# Patient Record
Sex: Male | Born: 1960 | Race: Black or African American | Hispanic: No | Marital: Married | Smoking: Former smoker
Health system: Southern US, Community
[De-identification: ages and names within clinical notes are randomized; demographics above are authoritative.]

## PROBLEM LIST (undated history)

## (undated) DIAGNOSIS — K429 Umbilical hernia without obstruction or gangrene: Secondary | ICD-10-CM

## (undated) DIAGNOSIS — R42 Dizziness and giddiness: Secondary | ICD-10-CM

## (undated) DIAGNOSIS — K219 Gastro-esophageal reflux disease without esophagitis: Secondary | ICD-10-CM

## (undated) DIAGNOSIS — C801 Malignant (primary) neoplasm, unspecified: Secondary | ICD-10-CM

## (undated) HISTORY — PX: COLON SURGERY: SHX602

## (undated) HISTORY — PX: HERNIA REPAIR: SHX51

## (undated) HISTORY — PX: APPENDECTOMY: SHX54

---

## 2012-04-24 HISTORY — PX: ABDOMINAL SURGERY: SHX537

## 2012-04-24 HISTORY — PX: ABDOMINAL EXPLORATION SURGERY: SHX538

## 2012-04-24 HISTORY — PX: COLONOSCOPY W/ POLYPECTOMY: SHX1380

## 2013-07-30 ENCOUNTER — Emergency Department (HOSPITAL_COMMUNITY): Payer: No Typology Code available for payment source

## 2013-07-30 ENCOUNTER — Encounter (HOSPITAL_COMMUNITY): Payer: Self-pay | Admitting: Emergency Medicine

## 2013-07-30 DIAGNOSIS — Z87891 Personal history of nicotine dependence: Secondary | ICD-10-CM | POA: Insufficient documentation

## 2013-07-30 DIAGNOSIS — Z880 Allergy status to penicillin: Secondary | ICD-10-CM | POA: Insufficient documentation

## 2013-07-30 DIAGNOSIS — J069 Acute upper respiratory infection, unspecified: Secondary | ICD-10-CM | POA: Insufficient documentation

## 2013-07-30 LAB — BASIC METABOLIC PANEL
BUN: 15 mg/dL (ref 6–23)
CHLORIDE: 98 meq/L (ref 96–112)
CO2: 23 mEq/L (ref 19–32)
Calcium: 8.9 mg/dL (ref 8.4–10.5)
Creatinine, Ser: 0.9 mg/dL (ref 0.50–1.35)
GFR calc non Af Amer: >90 mL/min (ref >90)
Glucose, Bld: 90 mg/dL (ref 70–99)
POTASSIUM: 4.4 meq/L (ref 3.7–5.3)
SODIUM: 138 meq/L (ref 137–147)

## 2013-07-30 LAB — CBC
HCT: 42.9 % (ref 39.0–52.0)
Hemoglobin: 14.8 g/dL (ref 13.0–17.0)
MCH: 33.6 pg (ref 26.0–34.0)
MCHC: 34.5 g/dL (ref 30.0–36.0)
MCV: 97.3 fL (ref 78.0–100.0)
PLATELETS: 185 10*3/uL (ref 150–400)
RBC: 4.41 MIL/uL (ref 4.22–5.81)
RDW: 11.8 % (ref 11.5–15.5)
WBC: 6.1 10*3/uL (ref 4.0–10.5)

## 2013-07-30 LAB — I-STAT TROPONIN, ED: Troponin i, poc: 0 ng/mL (ref 0.00–0.08)

## 2013-07-30 LAB — PRO B NATRIURETIC PEPTIDE: PRO B NATRI PEPTIDE: 39.1 pg/mL (ref 0–125)

## 2013-07-30 NOTE — ED Notes (Signed)
Pt states he has been battling a cold for a week. Productive Cough with thick white mucus, Fever, chills Body aches all over with back pain and CP. Top of head is sore to touch.

## 2013-07-31 ENCOUNTER — Emergency Department (HOSPITAL_COMMUNITY)
Admission: EM | Admit: 2013-07-31 | Discharge: 2013-07-31 | Disposition: A | Discharge status: Home / Self Care | Payer: No Typology Code available for payment source | Attending: Emergency Medicine | Admitting: Emergency Medicine

## 2013-07-31 DIAGNOSIS — J069 Acute upper respiratory infection, unspecified: Secondary | ICD-10-CM

## 2013-07-31 MED ORDER — HYDROCODONE-ACETAMINOPHEN 7.5-325 MG/15ML PO SOLN: 10.0000 mL | Freq: Four times a day (QID) | ORAL | Status: DC | PRN

## 2013-07-31 MED ORDER — IBUPROFEN 800 MG PO TABS
800.0000 mg | ORAL_TABLET | Freq: Once | ORAL | Status: AC
  Administered 2013-07-31: 800 mg via ORAL
  Filled 2013-07-31: qty 1

## 2013-07-31 MED ORDER — HYDROCODONE-ACETAMINOPHEN 7.5-325 MG/15ML PO SOLN
10.0000 mL | Freq: Once | ORAL | Status: AC
  Administered 2013-07-31: 10 mL via ORAL
  Filled 2013-07-31: qty 15

## 2013-07-31 MED ORDER — AZITHROMYCIN 250 MG PO TABS: 250.0000 mg | ORAL_TABLET | Freq: Every day | ORAL | Status: DC

## 2013-07-31 MED ORDER — ACETAMINOPHEN 325 MG PO TABS: 650.0000 mg | ORAL_TABLET | Freq: Once | ORAL | Status: DC

## 2013-07-31 NOTE — ED Provider Notes (Signed)
CSN: [id number]     Arrival date & time 07/30/13  2051 History   First MD Initiated Contact with Patient 07/31/13 0350     Chief Complaint  Patient presents with  . URI     (Consider location/radiation/quality/duration/timing/severity/associated sxs/prior Treatment) HPI History provided by patient. Sick for the last 7 days with cough, congestion, body aches, productive sputum and now general malaise. He gets sharp chest pains when he coughs. Complains of fever today. Recently moved here from [place] without any known sick contacts. No rash. No international travel. No shortness of breath. No neck stiffness. Unable to sleep due to cough. Symptoms moderate in severity.  History reviewed. No pertinent past medical history. Past Surgical History  Procedure Laterality Date  . Colon surgery    . Appendectomy     History reviewed. No pertinent family history. History  Substance Use Topics  . Smoking status: Former Research scientist (life sciences)  . Smokeless tobacco: Never Used  . Alcohol Use: No    Review of Systems  Constitutional: Positive for fever and chills.  HENT: Positive for congestion. Negative for voice change.   Respiratory: Positive for cough. Negative for shortness of breath.   Cardiovascular: Positive for chest pain.  Gastrointestinal: Negative for nausea, vomiting and abdominal pain.  Genitourinary: Negative for dysuria.  Musculoskeletal: Negative for neck stiffness.  Skin: Negative for rash.  Neurological: Negative for syncope.  All other systems reviewed and are negative.     Allergies  Penicillins  Home Medications  No current outpatient prescriptions on file. BP 110/61  Pulse 93  Temp(Src) 99.9 F (37.7 C) (Oral)  Resp 18  Ht 5\' 7"  (1.702 m)  Wt 158 lb 6.4 oz (71.85 kg)  BMI 24.80 kg/m2  SpO2 100% Physical Exam  Constitutional: He is oriented to person, place, and time. He appears well-developed and well-nourished.  HENT:  Head: Normocephalic and atraumatic.   Mouth/Throat: Oropharynx is clear and moist. No oropharyngeal exudate.  Eyes: EOM are normal. Pupils are equal, round, and reactive to light.  Neck: Normal range of motion. Neck supple.  Cardiovascular: Normal rate, regular rhythm and intact distal pulses.   Pulmonary/Chest: Effort normal and breath sounds normal. No respiratory distress. He exhibits no tenderness.  Intermittent dry cough during exam  Abdominal: Soft. Bowel sounds are normal. He exhibits no distension.  Musculoskeletal: Normal range of motion. He exhibits no edema and no tenderness.  Neurological: He is alert and oriented to person, place, and time. No cranial nerve deficit.  Skin: Skin is warm and dry.    ED Course  Procedures (including critical care time) Labs Review Labs Reviewed  [place], ED   Imaging Review Dg Chest 2 View  07/30/2013   CLINICAL DATA:  URI. Left-sided chest pain with cough, congestion and fever.  EXAM: CHEST  2 VIEW  COMPARISON:  None.  FINDINGS: Lungs are clear. Cardiomediastinal silhouette is within normal. Fundal soft tissues are normal.  IMPRESSION: No active cardiopulmonary disease.   Electronically Signed   By: Marin Olp M.D.   On: 07/30/2013 22:48   Treated with Motrin, and Lortab elixir for pain control/antitussive  Plan discharge home with URI precautions and instructions. Z-Pak provided.  Outpatient referral provided by request Patient will call to schedule primary care physician followup and return emergency department for any worsening condition.   MDM   Dx: URI  Evaluated with chest x-ray, labs obtained and reviewed as above. No leukocytosis. Chest  x-ray does not demonstrate any infiltrates or acute cardiopulmonary abnormality.  Medications provided  Vital signs and nursing notes reviewed and considered  Teressa Lower, MD 07/31/13 [phone]

## 2013-07-31 NOTE — ED Notes (Signed)
MD at bedside. 

## 2013-07-31 NOTE — Discharge Instructions (Signed)

## 2013-10-27 ENCOUNTER — Emergency Department (HOSPITAL_COMMUNITY): Payer: PRIVATE HEALTH INSURANCE

## 2013-10-27 ENCOUNTER — Emergency Department (HOSPITAL_COMMUNITY)
Admission: EM | Admit: 2013-10-27 | Discharge: 2013-10-27 | Disposition: A | Discharge status: Home / Self Care | Payer: PRIVATE HEALTH INSURANCE | Attending: Emergency Medicine | Admitting: Emergency Medicine

## 2013-10-27 ENCOUNTER — Encounter (HOSPITAL_COMMUNITY): Payer: Self-pay | Admitting: Emergency Medicine

## 2013-10-27 DIAGNOSIS — R1084 Generalized abdominal pain: Secondary | ICD-10-CM

## 2013-10-27 DIAGNOSIS — Z9089 Acquired absence of other organs: Secondary | ICD-10-CM | POA: Insufficient documentation

## 2013-10-27 DIAGNOSIS — Z9889 Other specified postprocedural states: Secondary | ICD-10-CM | POA: Insufficient documentation

## 2013-10-27 DIAGNOSIS — K439 Ventral hernia without obstruction or gangrene: Secondary | ICD-10-CM | POA: Insufficient documentation

## 2013-10-27 DIAGNOSIS — K429 Umbilical hernia without obstruction or gangrene: Secondary | ICD-10-CM | POA: Insufficient documentation

## 2013-10-27 DIAGNOSIS — Z87891 Personal history of nicotine dependence: Secondary | ICD-10-CM | POA: Insufficient documentation

## 2013-10-27 LAB — COMPREHENSIVE METABOLIC PANEL
ALK PHOS: 59 U/L (ref 39–117)
ALT: 38 U/L (ref 0–53)
ANION GAP: 11 (ref 5–15)
AST: 20 U/L (ref 0–37)
Albumin: 3.9 g/dL (ref 3.5–5.2)
BUN: 12 mg/dL (ref 6–23)
CO2: 27 mEq/L (ref 19–32)
CREATININE: 1.05 mg/dL (ref 0.50–1.35)
Calcium: 9.3 mg/dL (ref 8.4–10.5)
Chloride: 105 mEq/L (ref 96–112)
GFR calc Af Amer: >90 mL/min (ref >90)
GFR calc non Af Amer: 80 mL/min — ABNORMAL LOW (ref >90)
Glucose, Bld: 98 mg/dL (ref 70–99)
POTASSIUM: 4.3 meq/L (ref 3.7–5.3)
Sodium: 143 mEq/L (ref 137–147)
TOTAL PROTEIN: 6.9 g/dL (ref 6.0–8.3)
Total Bilirubin: 0.4 mg/dL (ref 0.3–1.2)

## 2013-10-27 LAB — URINALYSIS, ROUTINE W REFLEX MICROSCOPIC
Bilirubin Urine: NEGATIVE
Glucose, UA: NEGATIVE mg/dL
Hgb urine dipstick: NEGATIVE
Ketones, ur: NEGATIVE mg/dL
LEUKOCYTES UA: NEGATIVE
Nitrite: NEGATIVE
PH: 7 (ref 5.0–8.0)
Protein, ur: NEGATIVE mg/dL
Specific Gravity, Urine: 1.005 (ref 1.005–1.030)
UROBILINOGEN UA: 0.2 mg/dL (ref 0.0–1.0)

## 2013-10-27 LAB — CBC
HEMATOCRIT: 43.5 % (ref 39.0–52.0)
Hemoglobin: 14.6 g/dL (ref 13.0–17.0)
MCH: 32.5 pg (ref 26.0–34.0)
MCHC: 33.6 g/dL (ref 30.0–36.0)
MCV: 96.9 fL (ref 78.0–100.0)
Platelets: 221 10*3/uL (ref 150–400)
RBC: 4.49 MIL/uL (ref 4.22–5.81)
RDW: 12.2 % (ref 11.5–15.5)
WBC: 6.9 10*3/uL (ref 4.0–10.5)

## 2013-10-27 LAB — LIPASE, BLOOD: LIPASE: 24 U/L (ref 11–59)

## 2013-10-27 MED ORDER — SODIUM CHLORIDE 0.9 % IV BOLUS (SEPSIS)
1000.0000 mL | Freq: Once | INTRAVENOUS | Status: AC
  Administered 2013-10-27: 1000 mL via INTRAVENOUS

## 2013-10-27 MED ORDER — IOHEXOL 300 MG/ML  SOLN
50.0000 mL | Freq: Once | INTRAMUSCULAR | Status: AC | PRN
  Administered 2013-10-27: 50 mL via ORAL

## 2013-10-27 MED ORDER — IOHEXOL 300 MG/ML  SOLN
100.0000 mL | Freq: Once | INTRAMUSCULAR | Status: AC | PRN
  Administered 2013-10-27: 100 mL via INTRAVENOUS

## 2013-10-27 NOTE — ED Notes (Signed)
Patient presents today with a chief complaint of periumbilical abdominal pain x 3 weeks with worsening pain and color change of stools to "dark green". Patient reports he's had abdominal surgery last year for a ruptured polyp. Patient denies bloody stools, diarrhea, and vomiting.

## 2013-10-27 NOTE — ED Provider Notes (Signed)
CSN: [id number]     Arrival date & time 10/27/13  1034 History   First MD Initiated Contact with Patient 10/27/13 1042     Chief Complaint  Patient presents with  . Abdominal Pain     (Consider location/radiation/quality/duration/timing/severity/associated sxs/prior Treatment) Patient is a 53 y.o. male presenting with abdominal pain. The history is provided by the patient.  Abdominal Pain Associated symptoms: no chest pain, no cough, no diarrhea, no dysuria, no fever, no hematuria, no shortness of breath, no sore throat and no vomiting   pt with hx exp lap approx 1 yr ago due to colon perf during colonoscopy, c/o mid/diffuse abdominal pain in the past few weeks. Constant, dull, waxing and waning in intensity, non radiating. w pain, denies specific exacerbating or alleviating factors. States in past couple weeks pain seems worse. Occurs at rest. No relation to eating. No relation to activity or exertion.  Nausea. No vomiting. Having normal bms. Denies previous hx similar abd pain. No wt loss. No dysuria, hematuria or gu c/o. No fever or chills.      History reviewed. No pertinent past medical history. Past Surgical History  Procedure Laterality Date  . Colon surgery    . Appendectomy    . Abdominal surgery     No family history on file. History  Substance Use Topics  . Smoking status: Former Research scientist (life sciences)  . Smokeless tobacco: Never Used  . Alcohol Use: No    Review of Systems  Constitutional: Negative for fever.  HENT: Negative for sore throat.   Eyes: Negative for redness.  Respiratory: Negative for cough and shortness of breath.   Cardiovascular: Negative for chest pain.  Gastrointestinal: Positive for abdominal pain. Negative for vomiting and diarrhea.  Genitourinary: Negative for dysuria, hematuria and flank pain.  Musculoskeletal: Negative for back pain and neck pain.  Skin: Negative for rash.  Neurological: Negative for headaches.  Hematological: Does not bruise/bleed  easily.  Psychiatric/Behavioral: Negative for confusion.      Allergies  Penicillins  Home Medications   Prior to Admission medications   Not on File   BP 123/73  Pulse 76  Temp(Src) 97.8 F (36.6 C) (Oral)  Resp 18  SpO2 99% Physical Exam  Nursing note and vitals reviewed. Constitutional: He is oriented to person, place, and time. He appears well-developed and well-nourished. No distress.  HENT:  Head: Atraumatic.  Eyes: Conjunctivae are normal. No scleral icterus.  Neck: Neck supple. No tracheal deviation present.  Cardiovascular: Normal rate, regular rhythm, normal heart sounds and intact distal pulses.  Exam reveals no gallop and no friction rub.   No murmur heard. Pulmonary/Chest: Effort normal and breath sounds normal. No accessory muscle usage. No respiratory distress.  Abdominal: Soft. Bowel sounds are normal. He exhibits no distension and no mass. There is tenderness. There is no rebound and no guarding.  Midline healed surgical scar. ?reduced ventral hernia. Mid abd tenderness, no rebound or guarding.   Genitourinary:  No cva tenderness.   Musculoskeletal: Normal range of motion. He exhibits no edema and no tenderness.  Neurological: He is alert and oriented to person, place, and time.  Skin: Skin is warm and dry. He is not diaphoretic.  Psychiatric: He has a normal mood and affect.    ED Course  Procedures (including critical care time) Labs Review  Results for orders placed during the hospital encounter of 10/27/13  CBC      Result Value Ref Range   WBC 6.9  4.0 - 10.5 K/uL  RBC 4.49  4.22 - 5.81 MIL/uL   Hemoglobin 14.6  13.0 - 17.0 g/dL   HCT 43.5  39.0 - 52.0 %   MCV 96.9  78.0 - 100.0 fL   MCH 32.5  26.0 - 34.0 pg   MCHC 33.6  30.0 - 36.0 g/dL   RDW 12.2  11.5 - 15.5 %   Platelets 221  150 - 400 K/uL  COMPREHENSIVE METABOLIC PANEL      Result Value Ref Range   Sodium 143  137 - 147 mEq/L   Potassium 4.3  3.7 - 5.3 mEq/L   Chloride 105  96 -  112 mEq/L   CO2 27  19 - 32 mEq/L   Glucose, Bld 98  70 - 99 mg/dL   BUN 12  6 - 23 mg/dL   Creatinine, Ser 1.05  0.50 - 1.35 mg/dL   Calcium 9.3  8.4 - 10.5 mg/dL   Total Protein 6.9  6.0 - 8.3 g/dL   Albumin 3.9  3.5 - 5.2 g/dL   AST 20  0 - 37 U/L   ALT 38  0 - 53 U/L   Alkaline Phosphatase 59  39 - 117 U/L   Total Bilirubin 0.4  0.3 - 1.2 mg/dL   GFR calc non Af Amer 80 (*) >90 mL/min   GFR calc Af Amer >90  >90 mL/min   Anion gap 11  5 - 15  LIPASE, BLOOD      Result Value Ref Range   Lipase 24  11 - 59 U/L  URINALYSIS, ROUTINE W REFLEX MICROSCOPIC      Result Value Ref Range   Color, Urine STRAW (*) YELLOW   APPearance CLEAR  CLEAR   Specific Gravity, Urine 1.005  1.005 - 1.030   pH 7.0  5.0 - 8.0   Glucose, UA NEGATIVE  NEGATIVE mg/dL   Hgb urine dipstick NEGATIVE  NEGATIVE   Bilirubin Urine NEGATIVE  NEGATIVE   Ketones, ur NEGATIVE  NEGATIVE mg/dL   Protein, ur NEGATIVE  NEGATIVE mg/dL   Urobilinogen, UA 0.2  0.0 - 1.0 mg/dL   Nitrite NEGATIVE  NEGATIVE   Leukocytes, UA NEGATIVE  NEGATIVE   Ct Abdomen Pelvis W Contrast  10/27/2013   CLINICAL DATA:  Abdominal pain.  EXAM: CT ABDOMEN AND PELVIS WITH CONTRAST  TECHNIQUE: Multidetector CT imaging of the abdomen and pelvis was performed using the standard protocol following bolus administration of intravenous contrast.  CONTRAST:  45mL OMNIPAQUE IOHEXOL 300 MG/ML SOLN, 144mL OMNIPAQUE IOHEXOL 300 MG/ML SOLN  COMPARISON:  None.  FINDINGS: The lung bases are clear. No pleural effusion or pulmonary nodule. The heart is normal in size. No pericardial effusion. The distal esophagus is grossly normal.  The liver is is unremarkable. No focal lesions or biliary dilatation. The gallbladder is normal. No common bile duct dilatation. The pancreas is normal. The spleen is normal in size. No focal lesions. The adrenal glands and kidneys are normal except for left renal cysts. No hydronephrosis. No ureteral or bladder calculi.  The stomach,  duodenum, small bowel and colon are unremarkable. No inflammatory changes, mass lesions or obstructive findings. No mesenteric or retroperitoneal mass or adenopathy small scattered lymph nodes are noted. The aorta and branch vessels are patent the major venous structures are patent.  The bladder, prostate gland and seminal vesicles are unremarkable. No pelvic mass, adenopathy or free pelvic fluid collections. Small scattered lymph nodes are noted. No inguinal mass or adenopathy. There are small anterior abdominal  wall hernias. One is located left paramidline above the umbilicus. There is also a smaller right paramidline near the umbilicus and a small umbilical hernia.  The bony structures are unremarkable.  IMPRESSION: 1. No acute abdominal/pelvic findings, mass lesions or adenopathy. 2. Simple appearing left renal cysts. 3. Small anterior abdominal wall hernias.   Electronically Signed   By: Kalman Jewels M.D.   On: 10/27/2013 12:42     MDM  Iv ns. Labs. Ct.  Reviewed nursing notes and prior charts for additional history.   Recheck abd soft nt. No nv. Afeb.   Discussed ct w pt and need for gen surg f/u.  Pt appears stable for d/c.     Mirna Mires, MD 10/28/13 1807

## 2013-10-27 NOTE — ED Notes (Signed)
Pt reports abdominal pain for 3-4 weeks. Pt reports dark green stool yesterday. Pt reports tender to umbilicus and lower abdominal pain. Pt reports nausea but denies vomiting. Pt reports hx of polyps.

## 2013-10-27 NOTE — ED Notes (Signed)
Pt escorted to discharge window. Verbalized understanding discharge instructions. In no acute distress.   

## 2013-10-27 NOTE — ED Notes (Signed)
Pt encouraged to void when able. 

## 2013-10-27 NOTE — ED Notes (Signed)
Patient transported to CT 

## 2013-10-27 NOTE — Discharge Instructions (Signed)
Your ct scan was read as showing:  There are small anterior abdominal wall hernias. One is located left paramidline above the umbilicus. There is also a smaller right paramidline near the umbilicus and a small umbilical hernia.  The bony structures are unremarkable.  IMPRESSION: 1. No acute abdominal/pelvic findings, mass lesions or adenopathy. 2. Simple appearing left renal cysts. 3. Small anterior abdominal wall hernias.  For hernias, follow up with general surgeon in the next 1-2 weeks - see referral - call office to arrange appointment.  Return to ER if worse, worsening or severe abdominal pain, persistent vomiting, other concern.      Hernia A hernia occurs when an internal organ pushes out through a weak spot in the abdominal wall. Hernias most commonly occur in the groin and around the navel. Hernias often can be pushed back into place (reduced). Most hernias tend to get worse over time. Some abdominal hernias can get stuck in the opening (irreducible or incarcerated hernia) and cannot be reduced. An irreducible abdominal hernia which is tightly squeezed into the opening is at risk for impaired blood supply (strangulated hernia). A strangulated hernia is a medical emergency. Because of the risk for an irreducible or strangulated hernia, surgery may be recommended to repair a hernia. CAUSES   Heavy lifting.  Prolonged coughing.  Straining to have a bowel movement.  A cut (incision) made during an abdominal surgery. HOME CARE INSTRUCTIONS   Bed rest is not required. You may continue your normal activities.  Avoid lifting more than 10 pounds (4.5 kg) or straining.  Cough gently. If you are a smoker it is best to stop. Even the best hernia repair can break down with the continual strain of coughing. Even if you do not have your hernia repaired, a cough will continue to aggravate the problem.  Do not wear anything tight over your hernia. Do not try to keep it in with an  outside bandage or truss. These can damage abdominal contents if they are trapped within the hernia sac.  Eat a normal diet.  Avoid constipation. Straining over long periods of time will increase hernia size and encourage breakdown of repairs. If you cannot do this with diet alone, stool softeners may be used. SEEK IMMEDIATE MEDICAL CARE IF:   You have a fever.  You develop increasing abdominal pain.  You feel nauseous or vomit.  Your hernia is stuck outside the abdomen, looks discolored, feels hard, or is tender.  You have any changes in your bowel habits or in the hernia that are unusual for you.  You have increased pain or swelling around the hernia.  You cannot push the hernia back in place by applying gentle pressure while lying down. MAKE SURE YOU:   Understand these instructions.  Will watch your condition.  Will get help right away if you are not doing well or get worse. Document Released: 04/10/2005 Document Revised: 07/03/2011 Document Reviewed: 11/28/2007 [facility] Patient Information 2015 [place], [place]. This information is not intended to replace advice given to you by your health care provider. Make sure you discuss any questions you have with your health care provider.

## 2013-11-13 ENCOUNTER — Ambulatory Visit (INDEPENDENT_AMBULATORY_CARE_PROVIDER_SITE_OTHER): Payer: No Typology Code available for payment source | Admitting: Surgery

## 2013-11-13 ENCOUNTER — Ambulatory Visit (INDEPENDENT_AMBULATORY_CARE_PROVIDER_SITE_OTHER): Payer: No Typology Code available for payment source | Admitting: General Surgery

## 2013-12-03 ENCOUNTER — Ambulatory Visit (INDEPENDENT_AMBULATORY_CARE_PROVIDER_SITE_OTHER): Payer: No Typology Code available for payment source | Admitting: General Surgery

## 2014-03-25 ENCOUNTER — Encounter (HOSPITAL_COMMUNITY): Payer: Self-pay | Admitting: Emergency Medicine

## 2014-03-25 ENCOUNTER — Emergency Department (HOSPITAL_COMMUNITY)
Admission: EM | Admit: 2014-03-25 | Discharge: 2014-03-25 | Disposition: A | Discharge status: Home / Self Care | Payer: No Typology Code available for payment source | Attending: Emergency Medicine | Admitting: Emergency Medicine

## 2014-03-25 ENCOUNTER — Emergency Department (HOSPITAL_COMMUNITY): Payer: No Typology Code available for payment source

## 2014-03-25 DIAGNOSIS — R079 Chest pain, unspecified: Secondary | ICD-10-CM | POA: Insufficient documentation

## 2014-03-25 DIAGNOSIS — R1011 Right upper quadrant pain: Secondary | ICD-10-CM | POA: Diagnosis not present

## 2014-03-25 DIAGNOSIS — Z880 Allergy status to penicillin: Secondary | ICD-10-CM | POA: Insufficient documentation

## 2014-03-25 DIAGNOSIS — R1012 Left upper quadrant pain: Secondary | ICD-10-CM | POA: Diagnosis not present

## 2014-03-25 DIAGNOSIS — R112 Nausea with vomiting, unspecified: Secondary | ICD-10-CM | POA: Diagnosis not present

## 2014-03-25 DIAGNOSIS — Z9089 Acquired absence of other organs: Secondary | ICD-10-CM | POA: Diagnosis not present

## 2014-03-25 DIAGNOSIS — R1013 Epigastric pain: Secondary | ICD-10-CM | POA: Insufficient documentation

## 2014-03-25 DIAGNOSIS — R109 Unspecified abdominal pain: Secondary | ICD-10-CM

## 2014-03-25 DIAGNOSIS — Z9889 Other specified postprocedural states: Secondary | ICD-10-CM | POA: Diagnosis not present

## 2014-03-25 DIAGNOSIS — Z720 Tobacco use: Secondary | ICD-10-CM | POA: Diagnosis not present

## 2014-03-25 HISTORY — DX: Umbilical hernia without obstruction or gangrene: K42.9

## 2014-03-25 LAB — COMPREHENSIVE METABOLIC PANEL
ALK PHOS: 57 U/L (ref 39–117)
ALT: 37 U/L (ref 0–53)
AST: 22 U/L (ref 0–37)
Albumin: 4.1 g/dL (ref 3.5–5.2)
Anion gap: 16 — ABNORMAL HIGH (ref 5–15)
BILIRUBIN TOTAL: 0.6 mg/dL (ref 0.3–1.2)
BUN: 12 mg/dL (ref 6–23)
CALCIUM: 9.4 mg/dL (ref 8.4–10.5)
CHLORIDE: 103 meq/L (ref 96–112)
CO2: 24 meq/L (ref 19–32)
Creatinine, Ser: 0.94 mg/dL (ref 0.50–1.35)
GFR calc Af Amer: >90 mL/min (ref >90)
Glucose, Bld: 123 mg/dL — ABNORMAL HIGH (ref 70–99)
Potassium: 4 mEq/L (ref 3.7–5.3)
SODIUM: 143 meq/L (ref 137–147)
Total Protein: 7.6 g/dL (ref 6.0–8.3)

## 2014-03-25 LAB — CBC
HCT: 40.6 % (ref 39.0–52.0)
Hemoglobin: 14 g/dL (ref 13.0–17.0)
MCH: 32.6 pg (ref 26.0–34.0)
MCHC: 34.5 g/dL (ref 30.0–36.0)
MCV: 94.4 fL (ref 78.0–100.0)
Platelets: 229 10*3/uL (ref 150–400)
RBC: 4.3 MIL/uL (ref 4.22–5.81)
RDW: 11.8 % (ref 11.5–15.5)
WBC: 7.1 10*3/uL (ref 4.0–10.5)

## 2014-03-25 LAB — LIPASE, BLOOD: Lipase: 19 U/L (ref 11–59)

## 2014-03-25 MED ORDER — SUCRALFATE 1 G PO TABS: 1.0000 g | ORAL_TABLET | Freq: Three times a day (TID) | ORAL | Status: DC

## 2014-03-25 MED ORDER — OMEPRAZOLE 20 MG PO CPDR: 20.0000 mg | DELAYED_RELEASE_CAPSULE | Freq: Every day | ORAL | Status: DC

## 2014-03-25 MED ORDER — FAMOTIDINE 20 MG PO TABS: 20.0000 mg | ORAL_TABLET | Freq: Two times a day (BID) | ORAL | Status: DC

## 2014-03-25 MED ORDER — GI COCKTAIL ~~LOC~~
30.0000 mL | Freq: Once | ORAL | Status: AC
  Administered 2014-03-25: 30 mL via ORAL
  Filled 2014-03-25: qty 30

## 2014-03-25 MED ORDER — ASPIRIN 81 MG PO CHEW
324.0000 mg | CHEWABLE_TABLET | Freq: Once | ORAL | Status: AC
  Administered 2014-03-25: 324 mg via ORAL
  Filled 2014-03-25: qty 4

## 2014-03-25 MED ORDER — ONDANSETRON HCL 4 MG/2ML IJ SOLN
4.0000 mg | Freq: Once | INTRAMUSCULAR | Status: DC
  Filled 2014-03-25 (×2): qty 2

## 2014-03-25 NOTE — ED Provider Notes (Signed)
CSN: [id number]     Arrival date & time 03/25/14  2017 History   First MD Initiated Contact with Patient 03/25/14 2047     Chief Complaint  Patient presents with  . Chest Pain     (Consider location/radiation/quality/duration/timing/severity/associated sxs/prior Treatment) HPI Mitchell Adams is a 53 y.o. male who presents to ED with complaint of epigastric chest pain. Pt states pain comes and goes. Independent of exertion. Worsened with eating. Today he was eating dinner, states about 30 min after developed increased pain in epigastric area radiating towards the chest. States pain also radiated into the back and around both sides. Denies changes in bowels. No fver, chills. Pain now resolved. States hx of similar pain, was seen in ED few weeks ago, told it was related to his hernias. States hx of colon resection, with large incision in mid abdomen, with ventral hernias. Pt denies blood in his stool. Denies dark tarry stools. Last bowel movement this morning and normal. No blood in emesis. No other complaints.     Past Medical History  Diagnosis Date  . Hernia, umbilical    Past Surgical History  Procedure Laterality Date  . Colon surgery    . Appendectomy    . Abdominal surgery     No family history on file. History  Substance Use Topics  . Smoking status: Current Every Day Smoker  . Smokeless tobacco: Never Used  . Alcohol Use: No    Review of Systems  Constitutional: Negative for fever and chills.  Respiratory: Negative for cough, chest tightness and shortness of breath.   Cardiovascular: Positive for chest pain. Negative for palpitations and leg swelling.  Gastrointestinal: Positive for nausea, vomiting and abdominal pain. Negative for diarrhea and abdominal distention.  Genitourinary: Negative for dysuria, urgency, frequency and hematuria.  Musculoskeletal: Negative for myalgias, arthralgias, neck pain and neck stiffness.  Skin: Negative for rash.  Allergic/Immunologic:  Negative for immunocompromised state.  Neurological: Negative for dizziness, weakness, light-headedness, numbness and headaches.  All other systems reviewed and are negative.     Allergies  Penicillins  Home Medications   Prior to Admission medications   Not on File   BP 109/76 mmHg  Pulse 64  Resp 16  SpO2 97% Physical Exam  Constitutional: He is oriented to person, place, and time. He appears well-developed and well-nourished. No distress.  HENT:  Head: Normocephalic and atraumatic.  Eyes: Conjunctivae are normal.  Neck: Neck supple.  Cardiovascular: Normal rate, regular rhythm, normal heart sounds and intact distal pulses.   No murmur heard. Pulmonary/Chest: Effort normal. No respiratory distress. He has no wheezes. He has no rales.  Abdominal: Soft. Bowel sounds are normal. He exhibits no distension. There is tenderness. There is no rebound.  RUP, epigastric, LUQ tenderness  Musculoskeletal: He exhibits no edema.  Neurological: He is alert and oriented to person, place, and time.  Skin: Skin is warm and dry.  Nursing note and vitals reviewed.   ED Course  Procedures (including critical care time) Labs Review Labs Reviewed  COMPREHENSIVE METABOLIC PANEL - Abnormal; Notable for the following:    Glucose, Bld 123 (*)    Anion gap 16 (*)    All other components within normal limits  CBC  LIPASE, BLOOD  I-STAT TROPOININ, ED    Imaging Review Dg Chest 2 View  03/25/2014   CLINICAL DATA:  Chest pain, intermittent cough for 2 months, smoker  EXAM: CHEST  2 VIEW  COMPARISON:  07/30/2013  FINDINGS: Cardiomediastinal silhouette is stable.  No acute infiltrate or pleural effusion. No pulmonary edema. Bony thorax is unremarkable.  IMPRESSION: No active cardiopulmonary disease.   Electronically Signed   By: Lahoma Crocker M.D.   On: 03/25/2014 21:26   US Abdomen Complete  03/25/2014   CLINICAL DATA:  Abdominal pain  EXAM: ULTRASOUND ABDOMEN COMPLETE  COMPARISON:  CT scan  10/27/2013.  FINDINGS: Gallbladder: No gallstones or wall thickening visualized. No sonographic Murphy sign noted.  Common bile duct: Diameter: 3.8 mm in diameter within normal limits.  Liver: No focal lesion identified. Within normal limits in parenchymal echogenicity.  IVC: No abnormality visualized.  Pancreas: Visualized portion unremarkable. Pancreatic duct measures 1.9 mm in diameter  Spleen: Size and appearance within normal limits. Measures 5.5 cm in length.  Right Kidney: Length: 8.7 cm. Echogenicity within normal limits. No mass or hydronephrosis visualized.  Left Kidney: Length: 11.7 cm. Normal echogenicity. No hydronephrosis. A midpole cyst measures 2.4 x 2.7 cm.  Abdominal aorta: No aneurysm visualized. Measures up to 1.9 cm in diameter.  Other findings: None.  IMPRESSION: 1. No gallstones are noted within gallbladder.  Normal CBD. 2. No hydronephrosis. There is a cyst in midpole of the left kidney measures 2.7 cm. 3. No aortic aneurysm.   Electronically Signed   By: Lahoma Crocker M.D.   On: 03/25/2014 22:44   Dg Abd 2 Views  03/25/2014   CLINICAL DATA:  Acute onset of mid to lower abdominal pain, with nausea and vomiting. Initial encounter.  EXAM: ABDOMEN - 2 VIEW  COMPARISON:  CT of the abdomen and pelvis performed 10/27/2013  FINDINGS: The visualized bowel gas pattern is unremarkable. Scattered air and stool filled loops of colon are seen; no abnormal dilatation of small bowel loops is seen to suggest small bowel obstruction. No free intra-abdominal air is identified, though evaluation for free air is limited on a single supine view.  The visualized osseous structures are within normal limits; the sacroiliac joints are unremarkable in appearance. The visualized lung bases are essentially clear.  IMPRESSION: Unremarkable bowel gas pattern; no free intra-abdominal air seen. Relatively small amount of stool noted in the colon.   Electronically Signed   By: Garald Balding M.D.   On: 03/25/2014 22:44      EKG Interpretation None      MDM   Final diagnoses:  Abdominal pain    Pt with epigastric pain after eating. Recurrent for several months. Pain free at this time. Will check labs, x-ray of the chest and abd, aspirin ordered. GI cocktail ordered.    Labs all unremarkable. ECG with no acute findings. Trop negative. Pt symptoms do not appear to be cardiac in nature. i suspect he most likely has gastritis or PUD. He reports hx of gastritis, states he used to take medications for this but ran out. Does not remember what he was on. Will get [place] to ro gallbladder problems.    [place] negative. Pt's abdomen is benign. He is symptom free. Will dc home with prilosec, pepcid, carafate. Follow up with pcp and GI. Return precautions discussed.   Filed Vitals:   03/25/14 2059 03/25/14 2236 03/25/14 2339  BP: 102/38 109/76 116/75  Pulse: 77 64 65  Temp:   98 F (36.7 C)  TempSrc:   Oral  Resp: 18 16 18   SpO2: 97% 97% 97%       Renold Genta, PA-C 03/26/14 0048  Charlesetta Shanks, MD 03/26/14 1907

## 2014-03-25 NOTE — ED Notes (Signed)
Pt reports intermittent mid-cp x 2 months, never been sough out medical attention.  Pt reports his mother and his sister had an MI in the past, states his sister died of it at 52yo.  Pt reports pain all around his bila sides to his back.  Pt reports he came to the ED tonight d/t n/v which started while eating dinner.  Pt also reports abd pain with n/v.  Currently has "3 hernias", had one repaired in the past.  Vomited yellow emesis.

## 2014-03-25 NOTE — Discharge Instructions (Signed)
Start taking pepcid, prilosec for abdominal pain. carafate with food. Follow up with primary care doctor and gastroenterology for further work up. See below diet reccomendations for acid reflex. Return if symptoms worsening.      Gastroesophageal Reflux Disease, Adult Gastroesophageal reflux disease (GERD) happens when acid from your stomach flows up into the esophagus. When acid comes in contact with the esophagus, the acid causes soreness (inflammation) in the esophagus. Over time, GERD may create small holes (ulcers) in the lining of the esophagus. CAUSES   Increased body weight. This puts pressure on the stomach, making acid rise from the stomach into the esophagus.  Smoking. This increases acid production in the stomach.  Drinking alcohol. This causes decreased pressure in the lower esophageal sphincter (valve or ring of muscle between the esophagus and stomach), allowing acid from the stomach into the esophagus.  Late evening meals and a full stomach. This increases pressure and acid production in the stomach.  A malformed lower esophageal sphincter. Sometimes, no cause is found. SYMPTOMS   Burning pain in the lower part of the mid-chest behind the breastbone and in the mid-stomach area. This may occur twice a week or more often.  Trouble swallowing.  Sore throat.  Dry cough.  Asthma-like symptoms including chest tightness, shortness of breath, or wheezing. DIAGNOSIS  Your caregiver may be able to diagnose GERD based on your symptoms. In some cases, X-rays and other tests may be done to check for complications or to check the condition of your stomach and esophagus. TREATMENT  Your caregiver may recommend over-the-counter or prescription medicines to help decrease acid production. Ask your caregiver before starting or adding any new medicines.  HOME CARE INSTRUCTIONS   Change the factors that you can control. Ask your caregiver for guidance concerning weight loss, quitting  smoking, and alcohol consumption.  Avoid foods and drinks that make your symptoms worse, such as:  Caffeine or alcoholic drinks.  Chocolate.  Peppermint or mint flavorings.  Garlic and onions.  Spicy foods.  Citrus fruits, such as oranges, lemons, or limes.  Tomato-based foods such as sauce, chili, salsa, and pizza.  Fried and fatty foods.  Avoid lying down for the 3 hours prior to your bedtime or prior to taking a nap.  Eat small, frequent meals instead of large meals.  Wear loose-fitting clothing. Do not wear anything tight around your waist that causes pressure on your stomach.  Raise the head of your bed 6 to 8 inches with wood blocks to help you sleep. Extra pillows will not help.  Only take over-the-counter or prescription medicines for pain, discomfort, or fever as directed by your caregiver.  Do not take aspirin, ibuprofen, or other nonsteroidal anti-inflammatory drugs (NSAIDs). SEEK IMMEDIATE MEDICAL CARE IF:   You have pain in your arms, neck, jaw, teeth, or back.  Your pain increases or changes in intensity or duration.  You develop nausea, vomiting, or sweating (diaphoresis).  You develop shortness of breath, or you faint.  Your vomit is green, yellow, black, or looks like coffee grounds or blood.  Your stool is red, bloody, or black. These symptoms could be signs of other problems, such as heart disease, gastric bleeding, or esophageal bleeding. MAKE SURE YOU:   Understand these instructions.  Will watch your condition.  Will get help right away if you are not doing well or get worse. Document Released: 01/18/2005 Document Revised: 07/03/2011 Document Reviewed: 10/28/2010 [facility] Patient Information 2015 [place], [place]. This information is not intended to replace  advice given to you by your health care provider. Make sure you discuss any questions you have with your health care provider.  Food Choices for Gastroesophageal Reflux Disease When you  have gastroesophageal reflux disease (GERD), the foods you eat and your eating habits are very important. Choosing the right foods can help ease the discomfort of GERD. WHAT GENERAL GUIDELINES DO I NEED TO FOLLOW?  Choose fruits, vegetables, whole grains, low-fat dairy products, and low-fat meat, fish, and poultry.  Limit fats such as oils, salad dressings, butter, nuts, and avocado.  Keep a food diary to identify foods that cause symptoms.  Avoid foods that cause reflux. These may be different for different people.  Eat frequent small meals instead of three large meals each day.  Eat your meals slowly, in a relaxed setting.  Limit fried foods.  Cook foods using methods other than frying.  Avoid drinking alcohol.  Avoid drinking large amounts of liquids with your meals.  Avoid bending over or lying down until 2-3 hours after eating. WHAT FOODS ARE NOT RECOMMENDED? The following are some foods and drinks that may worsen your symptoms: Vegetables Tomatoes. Tomato juice. Tomato and spaghetti sauce. Chili peppers. Onion and garlic. Horseradish. Fruits Oranges, grapefruit, and lemon (fruit and juice). Meats High-fat meats, fish, and poultry. This includes hot dogs, ribs, ham, sausage, salami, and bacon. Dairy Whole milk and chocolate milk. Sour cream. Cream. Butter. Ice cream. Cream cheese.  Beverages Coffee and tea, with or without caffeine. Carbonated beverages or energy drinks. Condiments Hot sauce. Barbecue sauce.  Sweets/Desserts Chocolate and cocoa. Donuts. Peppermint and spearmint. Fats and Oils High-fat foods, including [place] fries and potato chips. Other Vinegar. Strong spices, such as black pepper, white pepper, red pepper, cayenne, curry powder, cloves, ginger, and chili powder. The items listed above may not be a complete list of foods and beverages to avoid. Contact your dietitian for more information. Document Released: 04/10/2005 Document Revised: 04/15/2013  Document Reviewed: 02/12/2013 [facility] Patient Information 2015 [place], [place]. This information is not intended to replace advice given to you by your health care provider. Make sure you discuss any questions you have with your health care provider.

## 2014-03-25 NOTE — ED Notes (Signed)
Pt presents with central chest pain with radiation to L chest onset 15-20 min ago, pt actively vomiting in triage

## 2014-03-26 LAB — I-STAT TROPONIN, ED: Troponin i, poc: 0 ng/mL (ref 0.00–0.08)

## 2014-04-28 ENCOUNTER — Emergency Department (HOSPITAL_BASED_OUTPATIENT_CLINIC_OR_DEPARTMENT_OTHER)
Admission: EM | Admit: 2014-04-28 | Discharge: 2014-04-29 | Disposition: A | Discharge status: Home / Self Care | Payer: PRIVATE HEALTH INSURANCE | Attending: Emergency Medicine | Admitting: Emergency Medicine

## 2014-04-28 ENCOUNTER — Encounter (HOSPITAL_BASED_OUTPATIENT_CLINIC_OR_DEPARTMENT_OTHER): Payer: Self-pay | Admitting: Emergency Medicine

## 2014-04-28 DIAGNOSIS — Z720 Tobacco use: Secondary | ICD-10-CM | POA: Diagnosis not present

## 2014-04-28 DIAGNOSIS — R0789 Other chest pain: Secondary | ICD-10-CM | POA: Insufficient documentation

## 2014-04-28 DIAGNOSIS — Z9889 Other specified postprocedural states: Secondary | ICD-10-CM | POA: Insufficient documentation

## 2014-04-28 DIAGNOSIS — Z79899 Other long term (current) drug therapy: Secondary | ICD-10-CM | POA: Diagnosis not present

## 2014-04-28 DIAGNOSIS — R1013 Epigastric pain: Secondary | ICD-10-CM | POA: Insufficient documentation

## 2014-04-28 DIAGNOSIS — R079 Chest pain, unspecified: Secondary | ICD-10-CM | POA: Diagnosis present

## 2014-04-28 DIAGNOSIS — Z880 Allergy status to penicillin: Secondary | ICD-10-CM | POA: Diagnosis not present

## 2014-04-28 DIAGNOSIS — Z9089 Acquired absence of other organs: Secondary | ICD-10-CM | POA: Diagnosis not present

## 2014-04-28 DIAGNOSIS — K439 Ventral hernia without obstruction or gangrene: Secondary | ICD-10-CM | POA: Diagnosis not present

## 2014-04-28 NOTE — ED Notes (Signed)
Pt states that about 1 month ago he started having CP just got worse tonight

## 2014-04-29 ENCOUNTER — Emergency Department (HOSPITAL_BASED_OUTPATIENT_CLINIC_OR_DEPARTMENT_OTHER): Payer: PRIVATE HEALTH INSURANCE

## 2014-04-29 LAB — LIPASE, BLOOD: LIPASE: 23 U/L (ref 11–59)

## 2014-04-29 LAB — CBC WITH DIFFERENTIAL/PLATELET
BASOS ABS: 0 10*3/uL (ref 0.0–0.1)
BASOS PCT: 0 % (ref 0–1)
EOS ABS: 0.1 10*3/uL (ref 0.0–0.7)
Eosinophils Relative: 2 % (ref 0–5)
HEMATOCRIT: 39.2 % (ref 39.0–52.0)
HEMOGLOBIN: 13.4 g/dL (ref 13.0–17.0)
LYMPHS ABS: 2.3 10*3/uL (ref 0.7–4.0)
Lymphocytes Relative: 37 % (ref 12–46)
MCH: 31.9 pg (ref 26.0–34.0)
MCHC: 34.2 g/dL (ref 30.0–36.0)
MCV: 93.3 fL (ref 78.0–100.0)
Monocytes Absolute: 0.5 10*3/uL (ref 0.1–1.0)
Monocytes Relative: 8 % (ref 3–12)
Neutro Abs: 3.3 10*3/uL (ref 1.7–7.7)
Neutrophils Relative %: 53 % (ref 43–77)
Platelets: 218 10*3/uL (ref 150–400)
RBC: 4.2 MIL/uL — ABNORMAL LOW (ref 4.22–5.81)
RDW: 11 % — ABNORMAL LOW (ref 11.5–15.5)
WBC: 6.2 10*3/uL (ref 4.0–10.5)

## 2014-04-29 LAB — COMPREHENSIVE METABOLIC PANEL
ALT: 37 U/L (ref 0–53)
ANION GAP: 6 (ref 5–15)
AST: 23 U/L (ref 0–37)
Albumin: 4 g/dL (ref 3.5–5.2)
Alkaline Phosphatase: 59 U/L (ref 39–117)
BUN: 11 mg/dL (ref 6–23)
CHLORIDE: 105 meq/L (ref 96–112)
CO2: 25 mmol/L (ref 19–32)
CREATININE: 0.86 mg/dL (ref 0.50–1.35)
Calcium: 9.1 mg/dL (ref 8.4–10.5)
GFR calc Af Amer: >90 mL/min (ref >90)
GFR calc non Af Amer: >90 mL/min (ref >90)
Glucose, Bld: 105 mg/dL — ABNORMAL HIGH (ref 70–99)
Potassium: 3.5 mmol/L (ref 3.5–5.1)
Sodium: 136 mmol/L (ref 135–145)
Total Bilirubin: 0.6 mg/dL (ref 0.3–1.2)
Total Protein: 7 g/dL (ref 6.0–8.3)

## 2014-04-29 MED ORDER — OXYCODONE-ACETAMINOPHEN 5-325 MG PO TABS: 1.0000 | ORAL_TABLET | Freq: Four times a day (QID) | ORAL | Status: DC | PRN

## 2014-04-29 MED ORDER — OXYCODONE-ACETAMINOPHEN 5-325 MG PO TABS
2.0000 | ORAL_TABLET | Freq: Once | ORAL | Status: AC
  Administered 2014-04-29: 2 via ORAL
  Filled 2014-04-29: qty 2

## 2014-04-29 MED ORDER — OMEPRAZOLE 20 MG PO CPDR: 20.0000 mg | DELAYED_RELEASE_CAPSULE | Freq: Every day | ORAL | Status: DC

## 2014-04-29 MED ORDER — METHOCARBAMOL 500 MG PO TABS
1000.0000 mg | ORAL_TABLET | Freq: Once | ORAL | Status: AC
  Administered 2014-04-29: 1000 mg via ORAL
  Filled 2014-04-29: qty 2

## 2014-04-29 MED ORDER — SUCRALFATE 1 G PO TABS: 1.0000 g | ORAL_TABLET | Freq: Three times a day (TID) | ORAL | Status: DC

## 2014-04-29 MED ORDER — METHOCARBAMOL 750 MG PO TABS: 750.0000 mg | ORAL_TABLET | Freq: Four times a day (QID) | ORAL | Status: DC | PRN

## 2014-04-29 NOTE — ED Provider Notes (Signed)
CSN: [id number]     Arrival date & time 04/28/14  2257 History   First MD Initiated Contact with Patient 04/28/14 2342     Chief Complaint  Patient presents with  . Chest Pain     (Consider location/radiation/quality/duration/timing/severity/associated sxs/prior Treatment) HPI 54 year old male presents to emergency department with complaint of ongoing left-sided chest pain.  Patient reports that he has been having daily all day every day pain for the last month.  He reports after eating, the pain becomes much worse.  Pain is located in the left side of his chest.  Patient was seen in the emergency department on December 2, at that time he had chest and abdomen x-rays, EKG, abdominal ultrasound and lab work. Symptoms were thought to be due to gastritis, patient was placed on Prilosec, Pepcid and Carafate.  Patient denies any improvement with these medications.  Patient has past history of ruptured large intestine after colonoscopy requiring emergency surgery.  Since that time, he has had abdominal wall hernias.  Patient reports he is having normal bowel movements, and no nausea or vomiting.  Chest pain is worse with movement and palpation.  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    Past Medical History  Diagnosis Date  . Hernia, umbilical    Past Surgical History  Procedure Laterality Date  . Colon surgery    . Appendectomy    . Abdominal surgery     History reviewed. No pertinent family history. History  Substance Use Topics  . Smoking status: Current Every Day Smoker  . Smokeless tobacco: Never Used  . Alcohol Use: No    Review of Systems  See History of Present Illness; otherwise all other systems are reviewed and negative   Allergies  Penicillins  Home Medications   Prior to Admission medications   Medication Sig Start Date End Date Taking? Authorizing Provider  famotidine (PEPCID) 20 MG tablet Take 1 tablet (20 mg total) by mouth 2 (two)  times daily. 03/25/14   Tatyana A Kirichenko, PA-C  omeprazole (PRILOSEC) 20 MG capsule Take 1 capsule (20 mg total) by mouth daily. 03/25/14   Tatyana A Kirichenko, PA-C  sucralfate (CARAFATE) 1 G tablet Take 1 tablet (1 g total) by mouth 4 (four) times daily -  with meals and at bedtime. 03/25/14   Tatyana A Kirichenko, PA-C   BP 119/83 mmHg  Pulse 72  Temp(Src) 98.3 F (36.8 C) (Oral)  Resp 18  Ht 5' 7.5" (1.715 m)  Wt 175 lb (79.379 kg)  BMI 26.99 kg/m2  SpO2 98% Physical Exam  Constitutional: He is oriented to person, place, and time. He appears well-developed and well-nourished. No distress.  HENT:  Head: Normocephalic and atraumatic.  Nose: Nose normal.  Mouth/Throat: Oropharynx is clear and moist.  Eyes: Conjunctivae and EOM are normal. Pupils are equal, round, and reactive to light.  Neck: Normal range of motion. Neck supple. No JVD present. No tracheal deviation present. No thyromegaly present.  Cardiovascular: Normal rate, regular rhythm, normal heart sounds and intact distal pulses.  Exam reveals no gallop and no friction rub.   No murmur heard. Pulmonary/Chest: Effort normal and breath sounds normal. No stridor. No respiratory distress. He has no wheezes. He has no rales. He exhibits tenderness (  Patient has tenderness to palpation of his left chest wall.  There are no overlying skin changes.  Palpation of the area reproduces his pain.).  Abdominal: Soft. Bowel sounds are  normal. He exhibits no distension and no mass. There is tenderness (patient has mild epigastric tenderness.  There is a small reducible hernia just lateral to his incisional scar.). There is no rebound and no guarding.  Musculoskeletal: Normal range of motion. He exhibits no edema or tenderness.  Lymphadenopathy:    He has no cervical adenopathy.  Neurological: He is alert and oriented to person, place, and time. He displays normal reflexes. He exhibits normal muscle tone. Coordination normal.  Skin: Skin is  warm and dry. No rash noted. No erythema. No pallor.  Psychiatric: He has a normal mood and affect. His behavior is normal. Judgment and thought content normal.  Nursing note and vitals reviewed.   ED Course  Procedures (including critical care time) Labs Review Labs Reviewed  CBC WITH DIFFERENTIAL - Abnormal; Notable for the following:    RBC 4.20 (*)    RDW 11.0 (*)    All other components within normal limits  COMPREHENSIVE METABOLIC PANEL - Abnormal; Notable for the following:    Glucose, Bld 105 (*)    All other components within normal limits  LIPASE, BLOOD    Imaging Review Dg Abd Acute W/chest  04/29/2014   CLINICAL DATA:  Left-sided chest pain for 1 month, increasing this morning. Increased pain on inspiration. 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  EXAM: ACUTE ABDOMEN SERIES (ABDOMEN 2 VIEW & CHEST 1 VIEW)  COMPARISON:  Chest and abdomen 03/25/2014  FINDINGS: Normal heart size and pulmonary vascularity. No focal airspace disease or consolidation in the lungs. No blunting of costophrenic angles. No pneumothorax. Mediastinal contours appear intact.  Scattered gas and stool in the colon. No small or large bowel distention. No free intra-abdominal air. No abnormal air-fluid levels. No radiopaque stones. Visualized bones appear intact.  IMPRESSION: No evidence of active pulmonary disease. Nonobstructive bowel gas pattern.   Electronically Signed   By: Lucienne Capers M.D.   On: 04/29/2014 00:59     EKG Interpretation   Date/Time:  Tuesday April 28 2014 23:05:04 EST Ventricular Rate:  72 PR Interval:  142 QRS Duration: 92 QT Interval:  372 QTC Calculation: 407 R Axis:   80 Text Interpretation:  Normal sinus rhythm Normal ECG No significant change  since last tracing Confirmed by Debby Freiberg [phone]) on 04/28/2014  11:06:39 PM     Results for orders placed or performed during the hospital encounter of 04/28/14  CBC with Differential  Result  Value Ref Range   WBC 6.2 4.0 - 10.5 K/uL   RBC 4.20 (L) 4.22 - 5.81 MIL/uL   Hemoglobin 13.4 13.0 - 17.0 g/dL   HCT 39.2 39.0 - 52.0 %   MCV 93.3 78.0 - 100.0 fL   MCH 31.9 26.0 - 34.0 pg   MCHC 34.2 30.0 - 36.0 g/dL   RDW 11.0 (L) 11.5 - 15.5 %   Platelets 218 150 - 400 K/uL   Neutrophils Relative % 53 43 - 77 %   Neutro Abs 3.3 1.7 - 7.7 K/uL   Lymphocytes Relative 37 12 - 46 %   Lymphs Abs 2.3 0.7 - 4.0 K/uL   Monocytes Relative 8 3 - 12 %   Monocytes Absolute 0.5 0.1 - 1.0 K/uL   Eosinophils Relative 2 0 - 5 %   Eosinophils Absolute 0.1 0.0 - 0.7 K/uL   Basophils Relative 0 0 - 1 %   Basophils Absolute 0.0 0.0 - 0.1 K/uL  Comprehensive metabolic panel  Result Value  Ref Range   Sodium 136 135 - 145 mmol/L   Potassium 3.5 3.5 - 5.1 mmol/L   Chloride 105 96 - 112 mEq/L   CO2 25 19 - 32 mmol/L   Glucose, Bld 105 (H) 70 - 99 mg/dL   BUN 11 6 - 23 mg/dL   Creatinine, Ser 0.86 0.50 - 1.35 mg/dL   Calcium 9.1 8.4 - 10.5 mg/dL   Total Protein 7.0 6.0 - 8.3 g/dL   Albumin 4.0 3.5 - 5.2 g/dL   AST 23 0 - 37 U/L   ALT 37 0 - 53 U/L   Alkaline Phosphatase 59 39 - 117 U/L   Total Bilirubin 0.6 0.3 - 1.2 mg/dL   GFR calc non Af Amer >90 >90 mL/min   GFR calc Af Amer >90 >90 mL/min   Anion gap 6 5 - 15  Lipase, blood  Result Value Ref Range   Lipase 23 11 - 59 U/L   Dg Abd Acute W/chest  04/29/2014   CLINICAL DATA:  Left-sided chest pain for 1 month, increasing this morning. Increased pain on inspiration. 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  EXAM: ACUTE ABDOMEN SERIES (ABDOMEN 2 VIEW & CHEST 1 VIEW)  COMPARISON:  Chest and abdomen 03/25/2014  FINDINGS: Normal heart size and pulmonary vascularity. No focal airspace disease or consolidation in the lungs. No blunting of costophrenic angles. No pneumothorax. Mediastinal contours appear intact.  Scattered gas and stool in the colon. No small or large bowel distention. No free intra-abdominal air. No abnormal  air-fluid levels. No radiopaque stones. Visualized bones appear intact.  IMPRESSION: No evidence of active pulmonary disease. Nonobstructive bowel gas pattern.   Electronically Signed   By: Lucienne Capers M.D.   On: 04/29/2014 00:59     MDM   Final diagnoses:  Chest pain  Chest wall pain  Postprandial epigastric pain    54 year old male with ongoing chest pain for the past month.  EKG unremarkable.  Pain is reproducible with palpation.  Pain is better after Percocet and Robaxin.  Chest x-ray unremarkable, labs unremarkable.  Patient encouraged to follow-up with primary care doctor and GI.  I feel that he has 2 issues going on, pain after eating is most likely gastritis/possible hiatal hernia, and chest wall pain may be due to overuse syndrome.    1:16 AM Pt feeling better at this time.  No pain with palpation.  Plan to refer to pcp, GI.  Kalman Drape, MD 04/29/14 [phone]

## 2014-04-29 NOTE — Discharge Instructions (Signed)
Take medications as prescribed.  It is important for you to find a local primary care doctor who can continue treating your ongoing issues.  Follow up with gastroenterology for further workup.  Chest Wall Pain Chest wall pain is pain in or around the bones and muscles of your chest. It may take up to 6 weeks to get better. It may take longer if you must stay physically active in your work and activities.  CAUSES  Chest wall pain may happen on its own. However, it may be caused by:  A viral illness like the flu.  Injury.  Coughing.  Exercise.  Arthritis.  Fibromyalgia.  Shingles. HOME CARE INSTRUCTIONS   Avoid overtiring physical activity. Try not to strain or perform activities that cause pain. This includes any activities using your chest or your abdominal and side muscles, especially if heavy weights are used.  Put ice on the sore area.  Put ice in a plastic bag.  Place a towel between your skin and the bag.  Leave the ice on for 15-20 minutes per hour while awake for the first 2 days.  Only take over-the-counter or prescription medicines for pain, discomfort, or fever as directed by your caregiver. SEEK IMMEDIATE MEDICAL CARE IF:   Your pain increases, or you are very uncomfortable.  You have a fever.  Your chest pain becomes worse.  You have new, unexplained symptoms.  You have nausea or vomiting.  You feel sweaty or lightheaded.  You have a cough with phlegm (sputum), or you cough up blood. MAKE SURE YOU:   Understand these instructions.  Will watch your condition.  Will get help right away if you are not doing well or get worse. Document Released: 04/10/2005 Document Revised: 07/03/2011 Document Reviewed: 12/05/2010 [facility] Patient Information 2015 [place], [place]. This information is not intended to replace advice given to you by your health care provider. Make sure you discuss any questions you have with your health care provider.  Abdominal  Pain Many things can cause belly (abdominal) pain. Most times, the belly pain is not dangerous. Many cases of belly pain can be watched and treated at home. HOME CARE   Do not take medicines that help you go poop (laxatives) unless told to by your doctor.  Only take medicine as told by your doctor.  Eat or drink as told by your doctor. Your doctor will tell you if you should be on a special diet. GET HELP IF:  You do not know what is causing your belly pain.  You have belly pain while you are sick to your stomach (nauseous) or have runny poop (diarrhea).  You have pain while you pee or poop.  Your belly pain wakes you up at night.  You have belly pain that gets worse or better when you eat.  You have belly pain that gets worse when you eat fatty foods.  You have a fever. GET HELP RIGHT AWAY IF:   The pain does not go away within 2 hours.  You keep throwing up (vomiting).  The pain changes and is only in the right or left part of the belly.  You have bloody or tarry looking poop. MAKE SURE YOU:   Understand these instructions.  Will watch your condition.  Will get help right away if you are not doing well or get worse. Document Released: 09/27/2007 Document Revised: 04/15/2013 Document Reviewed: 12/18/2012 [facility] Patient Information 2015 [place], [place]. This information is not intended to replace advice given to you  by your health care provider. Make sure you discuss any questions you have with your health care provider.   Emergency Department Resource Guide 1) Find a Doctor and Pay Out of Pocket Although you won't have to find out who is covered by your insurance plan, it is a good idea to ask around and get recommendations. You will then need to call the office and see if the doctor you have chosen will accept you as a new patient and what types of options they offer for patients who are self-pay. Some doctors offer discounts or will set up payment plans for their  patients who do not have insurance, but you will need to ask so you aren't surprised when you get to your appointment.  2) Contact Your Local Health Department Not all health departments have doctors that can see patients for sick visits, but many do, so it is worth a call to see if yours does. If you don't know where your local health department is, you can check in your phone book. The CDC also has a tool to help you locate your state's health department, and many state websites also have listings of all of their local health departments.  3) Find a [facility] If your illness is not likely to be very severe or complicated, you may want to try a walk in clinic. These are popping up all over the country in pharmacies, drugstores, and shopping centers. They're usually staffed by nurse practitioners or physician assistants that have been trained to treat common illnesses and complaints. They're usually fairly quick and inexpensive. However, if you have serious medical issues or chronic medical problems, these are probably not your best option.  No Primary Care Doctor: - Call Health Connect at  [phone] - they can help you locate a primary care doctor that  accepts your insurance, provides certain services, etc. - Physician Referral Service- [phone]  Chronic Pain Problems: Organization         Address  Phone   Notes  [facility]  [phone] Patients need to be referred by their primary care doctor.   Medication Assistance: Organization         Address  Phone   Notes  [facility] Medication [facility] [address] [place], [place] [address] [phone] --Must be a resident of [facility] -- Must have NO insurance coverage whatsoever (no Medicaid/ Medicare, etc.) -- The pt. MUST have a primary care doctor that directs their care regularly and follows them in the community   MedAssist  [phone]   [employer]  [phone]     Agencies that provide inexpensive medical care: Organization         Address  Phone   Notes  [place]  [phone]   [facility]    [phone]   [facility] [place],  [address] [phone]   [place] [address] [place] [phone]   Planned Parenthood    [phone]   [facility]    [phone]   [place] and [place] [address], [place] Phone:  [phone], Fax:  [phone] Hours of Operation:  9 am - 6 pm, M-F.  Also accepts Medicaid/Medicare and self-pay.  [facility] for [place] [address], [address], [place] Phone: [phone], Fax: [phone].  Hours of Operation:  8:30 am - 5:30 pm, M-F.  Also accepts Medicaid and self-pay.  [facility] [place] [address], [place] Phone: [phone]   [place], [place], [place] [phone], Ext. [phone] Mondays & Thursdays: 7-9 AM.  First 15 patients are seen on a first come, first serve basis.    [place] Providers:  Organization         Address  Phone   Notes  [facility] [address], [place] [phone] Also accepts self-pay patients.  [facility] [address], [place]  [phone]   [place], [address], [place] [phone]   [facility] [address], [place] [phone]   Lucianne Lei [address], [place]   [phone] Only accepts [place] Access [place] patients after they have their name applied to their card.   Self-Pay (no insurance) in [facility]:  Organization         Address  Phone   Notes  Sickle Cell Patients, [facility] [place] [phone]    [facility] Urgent Care [place] [phone]   [facility] [place]  [place], [place], [place] [phone]   [facility]/Dr. Osei-Bonsu  [address] [place] or [address], [place] [phone] Phone number for both [place] and [place] locations is the same.  Urgent Medical and [facility] [address] [place] [phone]   [facility] [address] [place] or [address] [phone]   [facility] [address], [place] [phone], phone; [phone], fax Sees patients 1st and 3rd Saturday of every month.  Must not qualify for public or private insurance (i.e. Medicaid, Medicare, [place] Health Choice, Veterans' Benefits)  Household income should be no more than 200% of the poverty level The clinic cannot treat you if you are pregnant or think you are pregnant  Sexually transmitted diseases are not treated at the clinic.    Dental Care: Organization         Address  Phone  Notes  [facility] Department of [facility] [place] [phone] Accepts children up to age 63 who are enrolled in [place] or [place]; pregnant women with a Medicaid card; and children who have applied for Medicaid or [place] Health Choice, but were declined, whose parents can pay a reduced fee at time of service.  [facility] Department of [facility]  [address], [place] [phone] Accepts children up to age 64 who are enrolled in [place] or [place]; pregnant women with a Medicaid card; and children who have applied for Medicaid or [place] Health Choice, but were declined, whose parents can pay a reduced fee at time of service.  [place] Adult Dental Access PROGRAM  [place] [phone] Patients are seen by  appointment only. Walk-ins are not accepted. [place] will see patients 59 years of age and older. Monday - Tuesday (8am-5pm) Most Wednesdays (8:30-5pm) $30 per visit, cash only  [facility] Adult [employer] PROGRAM  [address], [facility] [phone] Patients  are seen by appointment only. Walk-ins are not accepted. [place] will see patients 11 years of age and older. One Wednesday Evening (Monthly: Volunteer Based).  $30 per visit, cash only  [place]  [phone] for adults; Children under age 47, call Graduate Pediatric Dentistry at [phone]. Children aged 39-14, please call [phone] to request a pediatric application.  Dental services are provided in all areas of dental care including fillings, crowns and bridges, complete and partial dentures, implants, gum treatment, root canals, and extractions. Preventive care is also provided. Treatment is provided to both adults and children. Patients are selected via a lottery and there is often a waiting list.   [facility] [address], [place]  [phone] [email or web address]   [facility] [address] [place], [place] [phone], Ext. [phone] Second and Fourth Thursday of each month, opens at 6:30 AM; Clinic ends at 9 AM.  Patients are seen on a first-come first-served basis, and a limited number are seen during each clinic.   [facility]  [address] [place], [place] [phone]   Eligibility Requirements You must have lived in [place], [place], or [place] counties for at least the last three months.   You cannot be eligible for state or federal sponsored [employer], including [employer], [place], or [employer].   You generally cannot be eligible for healthcare insurance through your employer.    How to apply: Eligibility screenings are held every Tuesday and Wednesday afternoon from 1:00 pm until 4:00 pm. You  do not need an appointment for the interview!  [facility] [address] [place], [place]   [place]  [place] Department  [place]  [phone]    Behavioral Health Resources in the Community: Intensive Outpatient Programs Organization         Address  Phone  Notes  [place]. [address] [place], [place] [phone]   [facility] Outpatient [address] [place], [place]   ADS: Alcohol [facility] Drug S[facility] [address], [place], [place]   [place] [address],  [place], [place] or [phone]   Substance Abuse Resources Organization         Address  Phone  Notes  Alcohol and Drug Services  [phone]   [place]  [phone]   [facility] [place]   Chinita Pester  [phone]   Residential & Outpatient Substance Abuse Program  [phone]   Psychological Services Organization         Address  Phone  Notes  [facility] [place]  [place]  [phone]   [place] [address], [place] or [phone]    Mobile Crisis Teams Organization         Address  Phone  Notes  [facility], Mobile Crisis Care Unit  [phone]   [facility]  [address] [place], [place]   [place] Levels [address] [place] [phone]    Self-Help/Support Groups Organization         Address  Phone             Notes  [place]. of [place] - variety of support groups  [place] Call for  more information  Narcotics Anonymous (NA), [facility] [address], [employer] [place]  2 meetings at this location   Residential Facilities manager          Address  Phone  Notes  [facility] [place],    [place]  [phone]   [facility]  [address], [place] [address], [place], [place]   [place], [place] [phone] Admissions: 8am-3pm M-F  Incentives Substance [place] [address]    [place], [place] [phone]   [facility] [address] [place], [place], [place]   [facility] [address]  [place], [place]   [facility] Programs - Intensive Outpatient [place] [address], [place], [place]   [facility] ([place].,  [place], [place] [phone] or [phone]   [facility] Services (RTS) [address], [place], [place] Accepts Medicaid  [facility] [place] [address],  [place] [phone] Substance Abuse/Addiction Treatment   [facility] Organization         Address  Phone  Notes  [facility]  [phone]   Domenic Schwab, PhD [address] [place]lis [place], [place]   [phone] or [phone]   [place] [employer], [place] [phone]   [facility] [address] [place], [place] [phone] Insurance/Medicaid/sponsorship through [facility] and Families [address], [address]                                    [place], [place] [phone] [place] [address], [place] [phone]    Dr. Adele Schilder  [phone]   [facility] Dept. 1) [address] [place] 2) [place] 3)  [place] [address], [place] [phone]  [phone]   [place] [phone] or [phone] (After Hours)

## 2014-09-22 ENCOUNTER — Emergency Department (HOSPITAL_COMMUNITY)
Admission: EM | Admit: 2014-09-22 | Discharge: 2014-09-22 | Disposition: A | Discharge status: Home / Self Care | Payer: PRIVATE HEALTH INSURANCE | Attending: Emergency Medicine | Admitting: Emergency Medicine

## 2014-09-22 ENCOUNTER — Encounter (HOSPITAL_COMMUNITY): Payer: Self-pay | Admitting: Emergency Medicine

## 2014-09-22 ENCOUNTER — Emergency Department (HOSPITAL_COMMUNITY): Payer: PRIVATE HEALTH INSURANCE

## 2014-09-22 DIAGNOSIS — Y939 Activity, unspecified: Secondary | ICD-10-CM | POA: Diagnosis not present

## 2014-09-22 DIAGNOSIS — W1830XA Fall on same level, unspecified, initial encounter: Secondary | ICD-10-CM | POA: Diagnosis not present

## 2014-09-22 DIAGNOSIS — M25569 Pain in unspecified knee: Secondary | ICD-10-CM

## 2014-09-22 DIAGNOSIS — S8992XA Unspecified injury of left lower leg, initial encounter: Secondary | ICD-10-CM | POA: Diagnosis not present

## 2014-09-22 DIAGNOSIS — Y929 Unspecified place or not applicable: Secondary | ICD-10-CM | POA: Insufficient documentation

## 2014-09-22 DIAGNOSIS — Z720 Tobacco use: Secondary | ICD-10-CM | POA: Insufficient documentation

## 2014-09-22 DIAGNOSIS — Y999 Unspecified external cause status: Secondary | ICD-10-CM | POA: Insufficient documentation

## 2014-09-22 DIAGNOSIS — Z79899 Other long term (current) drug therapy: Secondary | ICD-10-CM | POA: Insufficient documentation

## 2014-09-22 DIAGNOSIS — Z880 Allergy status to penicillin: Secondary | ICD-10-CM | POA: Diagnosis not present

## 2014-09-22 DIAGNOSIS — Z8719 Personal history of other diseases of the digestive system: Secondary | ICD-10-CM | POA: Insufficient documentation

## 2014-09-22 MED ORDER — MELOXICAM 7.5 MG PO TABS: 7.5000 mg | ORAL_TABLET | Freq: Every day | ORAL | Status: DC

## 2014-09-22 MED ORDER — OXYCODONE-ACETAMINOPHEN 5-325 MG PO TABS
1.0000 | ORAL_TABLET | Freq: Once | ORAL | Status: AC
Start: 2014-09-22 — End: 2014-09-22
  Administered 2014-09-22: 1 via ORAL
  Filled 2014-09-22: qty 1

## 2014-09-22 NOTE — ED Provider Notes (Signed)
CSN: [id number]     Arrival date & time 09/22/14  1603 History  This chart was scribed for Etta Quill NP, working with Noemi Chapel, MD by Rayna Sexton, ED Scribe. This patient was seen in room TR04C/TR04C and the patient's care was started at 4:48 PM.     Chief Complaint  Patient presents with  . Knee Pain    The history is provided by the patient. No language interpreter was used.    HPI Comments: Mitchell Adams is a 54 y.o. male who presents to the Emergency Department complaining of constant, moderate, left, knee pain with onset 3 days ago. Pt notes that he cannot bear weight with the affected area and has trouble walking. Pt notes a fall that occurred 4 days ago with onset of pain not beginning until a day later. Pt confirms surgical history including a colonoscopy and an appendectomy. Pt denies any current prescriptions. Pt denies any swelling.     Past Medical History  Diagnosis Date  . Hernia, umbilical    Past Surgical History  Procedure Laterality Date  . Colon surgery    . Appendectomy    . Abdominal surgery     No family history on file. History  Substance Use Topics  . Smoking status: Current Every Day Smoker  . Smokeless tobacco: Never Used  . Alcohol Use: No    Review of Systems  Musculoskeletal: Positive for arthralgias. Negative for joint swelling.  All other systems reviewed and are negative.     Allergies  Penicillins  Home Medications   Prior to Admission medications   Medication Sig Start Date End Date Taking? Authorizing Provider  famotidine (PEPCID) 20 MG tablet Take 1 tablet (20 mg total) by mouth 2 (two) times daily. 03/25/14   Tatyana Kirichenko, PA-C  methocarbamol (ROBAXIN-750) 750 MG tablet Take 1 tablet (750 mg total) by mouth every 6 (six) hours as needed for muscle spasms. 04/29/14   Linton Flemings, MD  omeprazole (PRILOSEC) 20 MG capsule Take 1 capsule (20 mg total) by mouth daily. 04/29/14   Linton Flemings, MD  oxyCODONE-acetaminophen  (PERCOCET/ROXICET) 5-325 MG per tablet Take 1-2 tablets by mouth every 6 (six) hours as needed for severe pain. 04/29/14   Linton Flemings, MD  sucralfate (CARAFATE) 1 G tablet Take 1 tablet (1 g total) by mouth 4 (four) times daily -  with meals and at bedtime. 04/29/14   Linton Flemings, MD   BP 117/83 mmHg  Pulse 81  Temp(Src) 98.2 F (36.8 C) (Oral)  Resp 20  SpO2 97% Physical Exam  Constitutional: He is oriented to person, place, and time. He appears well-developed and well-nourished. No distress.  HENT:  Head: Normocephalic and atraumatic.  Eyes: Conjunctivae and EOM are normal.  Neck: Neck supple.  Cardiovascular: Normal rate and regular rhythm.   Distal pulses intact  Pulmonary/Chest: Breath sounds normal. No respiratory distress.  Musculoskeletal:  No tenderness, swelling, ecchymosis  Neurological: He is alert and oriented to person, place, and time.  Skin: Skin is warm and dry.  Psychiatric: He has a normal mood and affect. His behavior is normal.  Nursing note and vitals reviewed.   ED Course  Procedures  DIAGNOSTIC STUDIES: Oxygen Saturation is 97% on RA, normal by my interpretation.    COORDINATION OF CARE: 4:51 PM Discussed treatment plan with pt at bedside including an x-ray of his left knee and pt agreed to plan.  Labs Review Labs Reviewed - No data to display  Imaging Review No results  found.   EKG Interpretation None     Radiology results reviewed and shared with patient. No acute findings. Knee immobilizer. Crutches. Anti-inflammatory. Ortho follow-up.  MDM   Final diagnoses:  None    Left knee pain.  I personally performed the services described in this documentation, which was scribed in my presence. The recorded information has been reviewed and is accurate.   Etta Quill, NP 09/23/14 0006  Noemi Chapel, MD 09/23/14 1018

## 2014-09-22 NOTE — ED Notes (Signed)
Pt reports he fell Friday and since then his L knee has progressively gotten more painful. sts it feels like it could give out.

## 2014-09-22 NOTE — ED Notes (Signed)
Pt to xray

## 2014-09-22 NOTE — Discharge Instructions (Signed)

## 2014-09-23 ENCOUNTER — Encounter (HOSPITAL_BASED_OUTPATIENT_CLINIC_OR_DEPARTMENT_OTHER): Payer: Self-pay | Admitting: Emergency Medicine

## 2015-01-11 ENCOUNTER — Emergency Department (HOSPITAL_COMMUNITY): Payer: PRIVATE HEALTH INSURANCE

## 2015-01-11 ENCOUNTER — Emergency Department (HOSPITAL_COMMUNITY)
Admission: EM | Admit: 2015-01-11 | Discharge: 2015-01-11 | Disposition: A | Discharge status: Home / Self Care | Payer: PRIVATE HEALTH INSURANCE | Attending: Emergency Medicine | Admitting: Emergency Medicine

## 2015-01-11 ENCOUNTER — Encounter (HOSPITAL_COMMUNITY): Payer: Self-pay | Admitting: Emergency Medicine

## 2015-01-11 DIAGNOSIS — R110 Nausea: Secondary | ICD-10-CM | POA: Insufficient documentation

## 2015-01-11 DIAGNOSIS — Z880 Allergy status to penicillin: Secondary | ICD-10-CM | POA: Diagnosis not present

## 2015-01-11 DIAGNOSIS — Z720 Tobacco use: Secondary | ICD-10-CM | POA: Diagnosis not present

## 2015-01-11 DIAGNOSIS — R630 Anorexia: Secondary | ICD-10-CM | POA: Insufficient documentation

## 2015-01-11 DIAGNOSIS — Z9049 Acquired absence of other specified parts of digestive tract: Secondary | ICD-10-CM | POA: Insufficient documentation

## 2015-01-11 DIAGNOSIS — K439 Ventral hernia without obstruction or gangrene: Secondary | ICD-10-CM | POA: Diagnosis not present

## 2015-01-11 DIAGNOSIS — K432 Incisional hernia without obstruction or gangrene: Secondary | ICD-10-CM

## 2015-01-11 DIAGNOSIS — R1033 Periumbilical pain: Secondary | ICD-10-CM | POA: Diagnosis present

## 2015-01-11 LAB — URINALYSIS, ROUTINE W REFLEX MICROSCOPIC
Bilirubin Urine: NEGATIVE
Glucose, UA: NEGATIVE mg/dL
Hgb urine dipstick: NEGATIVE
Ketones, ur: NEGATIVE mg/dL
LEUKOCYTES UA: NEGATIVE
Nitrite: NEGATIVE
PROTEIN: NEGATIVE mg/dL
Specific Gravity, Urine: 1.028 (ref 1.005–1.030)
UROBILINOGEN UA: 1 mg/dL (ref 0.0–1.0)
pH: 5.5 (ref 5.0–8.0)

## 2015-01-11 LAB — COMPREHENSIVE METABOLIC PANEL
ALK PHOS: 54 U/L (ref 38–126)
ALT: 34 U/L (ref 17–63)
ANION GAP: 7 (ref 5–15)
AST: 26 U/L (ref 15–41)
Albumin: 4.2 g/dL (ref 3.5–5.0)
BILIRUBIN TOTAL: 0.8 mg/dL (ref 0.3–1.2)
BUN: 10 mg/dL (ref 6–20)
CALCIUM: 9.6 mg/dL (ref 8.9–10.3)
CO2: 27 mmol/L (ref 22–32)
Chloride: 107 mmol/L (ref 101–111)
Creatinine, Ser: 0.96 mg/dL (ref 0.61–1.24)
GFR calc Af Amer: >60 mL/min (ref >60)
GFR calc non Af Amer: >60 mL/min (ref >60)
Glucose, Bld: 107 mg/dL — ABNORMAL HIGH (ref 65–99)
Potassium: 4.3 mmol/L (ref 3.5–5.1)
Sodium: 141 mmol/L (ref 135–145)
TOTAL PROTEIN: 7.1 g/dL (ref 6.5–8.1)

## 2015-01-11 LAB — CBC
HCT: 42.5 % (ref 39.0–52.0)
HEMOGLOBIN: 14.3 g/dL (ref 13.0–17.0)
MCH: 31.8 pg (ref 26.0–34.0)
MCHC: 33.6 g/dL (ref 30.0–36.0)
MCV: 94.7 fL (ref 78.0–100.0)
Platelets: 232 10*3/uL (ref 150–400)
RBC: 4.49 MIL/uL (ref 4.22–5.81)
RDW: 11.9 % (ref 11.5–15.5)
WBC: 5.8 10*3/uL (ref 4.0–10.5)

## 2015-01-11 LAB — LIPASE, BLOOD: Lipase: 17 U/L — ABNORMAL LOW (ref 22–51)

## 2015-01-11 LAB — I-STAT CG4 LACTIC ACID, ED: Lactic Acid, Venous: 0.71 mmol/L (ref 0.5–2.0)

## 2015-01-11 MED ORDER — ONDANSETRON HCL 4 MG/2ML IJ SOLN
4.0000 mg | Freq: Once | INTRAMUSCULAR | Status: AC
  Administered 2015-01-11: 4 mg via INTRAVENOUS
  Filled 2015-01-11: qty 2

## 2015-01-11 MED ORDER — IOHEXOL 300 MG/ML  SOLN
100.0000 mL | Freq: Once | INTRAMUSCULAR | Status: AC | PRN
  Administered 2015-01-11: 100 mL via INTRAVENOUS

## 2015-01-11 MED ORDER — MORPHINE SULFATE (PF) 4 MG/ML IV SOLN
4.0000 mg | Freq: Once | INTRAVENOUS | Status: AC
  Administered 2015-01-11: 4 mg via INTRAVENOUS
  Filled 2015-01-11: qty 1

## 2015-01-11 NOTE — ED Notes (Signed)
Patient states hernia for a while.  Patient states abdominal pain x 2 days.   Patient took 2 tylenol for pain.  Patient denies other symptoms.

## 2015-01-11 NOTE — ED Provider Notes (Signed)
Patient care accepted in signout from Dr. Renne Musca. Please refer to his note for full history and physical.  Mr. Mitchell Adams is a 54 year old male with chronic abdominal pain. PSHx bowel perforation s/p colonoscopy requiring ex-lap w/ resulting umbilical hernia. Presenting with acute on chronic pain today. No passing of flatus. Nausea but no emesis. No fever or chills.  CT abdomen pelvis showing 2 adjacent abdominal wall hernias but only containing fat , no other acute intra-abdominal findings. Patient will follow-up with surgery outpatient.  Pt discharged home in stable condition. Strict ED return precautions dicussed. Pt understands and agrees with the plan and has no further questions or concerns.   Pt care discussed with and followed by my attending, Dr. Zachery Conch, MD Pager [phone]   Mayer Camel, MD 01/11/15 1586  Orpah Greek, MD 01/19/15 2053

## 2015-01-11 NOTE — Discharge Instructions (Signed)
Hernia °A hernia happens when an organ inside your body pushes out through a weak spot in your belly (abdominal) wall. Most hernias get worse over time. They can often be pushed back into place (reduced). Surgery may be needed to repair hernias that cannot be pushed into place. °HOME CARE °· Keep doing normal activities. °· Avoid lifting more than 10 pounds (4.5 kilograms). °· Cough gently and avoid straining. Over time, these things will: °¨ Increase your hernia size. °¨ Irritate your hernia. °¨ Break down hernia repairs. °· Stop smoking. °· Do not wear anything tight over your hernia. Do not keep the hernia in with an outside bandage. °· Eat food that is high in fiber (fruit, vegetables, whole grains). °· Drink enough fluids to keep your pee (urine) clear or pale yellow. °· Take medicines to make your poop soft (stool softeners) if you cannot poop (constipated). °GET HELP RIGHT AWAY IF:  °· You have a fever. °· You have belly pain that gets worse. °· You feel sick to your stomach (nauseous) and throw up (vomit). °· Your skin starts to bulge out. °· Your hernia turns a different color, feels hard, or is tender. °· You have increased pain or puffiness (swelling) around the hernia. °· You poop more or less often. °· Your poop does not look the way normally does. °· You have watery poop (diarrhea). °· You cannot push the hernia back in place by applying gentle pressure while lying down. °MAKE SURE YOU:  °· Understand these instructions. °· Will watch your condition. °· Will get help right away if you are not doing well or get worse. °Document Released: 09/28/2009 Document Revised: 07/03/2011 Document Reviewed: 09/28/2009 °ExitCare® Patient Information ©2015 [employer]. This information is not intended to replace advice given to you by your health care provider. Make sure you discuss any questions you have with your health care provider. ° °

## 2015-01-11 NOTE — ED Provider Notes (Signed)
CSN: [id number]     Arrival date & time 01/11/15  0913 History   First MD Initiated Contact with Patient 01/11/15 1418     Chief Complaint  Patient presents with  . Abdominal Pain     (Consider location/radiation/quality/duration/timing/severity/associated sxs/prior Treatment) Patient is a 54 y.o. male presenting with abdominal pain.  Abdominal Pain Pain location:  Periumbilical Pain quality: aching   Pain radiates to:  Does not radiate Pain severity:  Severe Onset quality:  Gradual Duration:  1 day Timing:  Constant Progression:  Worsening Chronicity:  Chronic Context comment:  Hernia Relieved by:  None tried Worsened by:  Nothing tried Ineffective treatments:  None tried Associated symptoms: anorexia and nausea   Associated symptoms: no chest pain, no chills, no diarrhea, no dysuria, no fever, no flatus, no shortness of breath, no sore throat and no vomiting   Risk factors: multiple surgeries     Past Medical History  Diagnosis Date  . Hernia, umbilical    Past Surgical History  Procedure Laterality Date  . Colon surgery    . Appendectomy    . Abdominal surgery     No family history on file. Social History  Substance Use Topics  . Smoking status: Current Some Day Smoker    Types: Cigarettes  . Smokeless tobacco: Never Used  . Alcohol Use: No    Review of Systems  Constitutional: Negative for fever and chills.  HENT: Negative for congestion and sore throat.   Eyes: Negative for visual disturbance.  Respiratory: Negative for shortness of breath and wheezing.   Cardiovascular: Negative for chest pain.  Gastrointestinal: Positive for nausea, abdominal pain, abdominal distention and anorexia. Negative for vomiting, diarrhea and flatus.  Genitourinary: Negative for dysuria and difficulty urinating.  Musculoskeletal: Negative for myalgias and arthralgias.  Skin: Negative for wound.  Neurological: Negative for syncope and headaches.  Psychiatric/Behavioral:  Negative for behavioral problems.  All other systems reviewed and are negative.     Allergies  Penicillins  Home Medications   Prior to Admission medications   Medication Sig Start Date End Date Taking? Authorizing Provider  famotidine (PEPCID) 20 MG tablet Take 1 tablet (20 mg total) by mouth 2 (two) times daily. Patient not taking: Reported on 01/11/2015 03/25/14   Jeannett Senior, PA-C  meloxicam (MOBIC) 7.5 MG tablet Take 1 tablet (7.5 mg total) by mouth daily. Patient not taking: Reported on 01/11/2015 09/22/14   Etta Quill, NP  methocarbamol (ROBAXIN-750) 750 MG tablet Take 1 tablet (750 mg total) by mouth every 6 (six) hours as needed for muscle spasms. Patient not taking: Reported on 01/11/2015 04/29/14   Linton Flemings, MD  omeprazole (PRILOSEC) 20 MG capsule Take 1 capsule (20 mg total) by mouth daily. Patient not taking: Reported on 01/11/2015 04/29/14   Linton Flemings, MD  oxyCODONE-acetaminophen (PERCOCET/ROXICET) 5-325 MG per tablet Take 1-2 tablets by mouth every 6 (six) hours as needed for severe pain. Patient not taking: Reported on 01/11/2015 04/29/14   Linton Flemings, MD  sucralfate (CARAFATE) 1 G tablet Take 1 tablet (1 g total) by mouth 4 (four) times daily -  with meals and at bedtime. Patient not taking: Reported on 01/11/2015 04/29/14   Linton Flemings, MD   BP 151/113 mmHg  Pulse 63  Temp(Src) 97.8 F (36.6 C) (Oral)  Resp 20  Wt 175 lb (79.379 kg)  SpO2 99% Physical Exam  Constitutional: He is oriented to person, place, and time. He appears well-developed and well-nourished.  HENT:  Head: Normocephalic and  atraumatic.  Eyes: EOM are normal.  Neck: Normal range of motion.  Cardiovascular: Normal rate, regular rhythm and normal heart sounds.   No murmur heard. Pulmonary/Chest: Effort normal and breath sounds normal. No respiratory distress.  Abdominal: Soft. Bowel sounds are normal. He exhibits distension. There is tenderness in the periumbilical area. There is no rigidity, no  rebound, no guarding and no CVA tenderness. A hernia is present. Hernia confirmed positive in the ventral area.  Musculoskeletal: He exhibits no edema.  Neurological: He is alert and oriented to person, place, and time.  Skin: No rash noted. He is not diaphoretic.    ED Course  Hernia reduction Date/Time: 01/11/2015 3:24 PM Performed by: Renne Musca Authorized by: Renne Musca Consent: Verbal consent obtained. Local anesthesia used: no Patient sedated: no Patient tolerance: Patient tolerated the procedure well with no immediate complications   (including critical care time) Labs Review Labs Reviewed  LIPASE, BLOOD - Abnormal; Notable for the following:    Lipase 17 (*)    All other components within normal limits  COMPREHENSIVE METABOLIC PANEL - Abnormal; Notable for the following:    Glucose, Bld 107 (*)    All other components within normal limits  CBC  URINALYSIS, ROUTINE W REFLEX MICROSCOPIC (NOT AT [facility])  I-STAT CG4 LACTIC ACID, ED    Imaging Review No results found. I have personally reviewed and evaluated these images and lab results as part of my medical decision-making.   EKG Interpretation None      MDM   Final diagnoses:  Recurrent ventral hernia     Patient is a 54 year old male that presents with. Medical abdominal pain. Patient has had an exploratory laparoscopy 2 years ago secondary to bowel perforation that resulted in a neutral hernia at the surgical site, appendix taken out years ago. Patient states since the surgery he has had chronic pain at the site however this morning became much worse and is ever had. Patient has been nauseated with this and has not been passing gas and has noticed some abdominal distention. On arrival to the ED the patient is afebrile stable vital signs. On exam patient's abdomen is mildly distended not tympanic with a ventral hernia. I was able to reduce the hernia however given his lack of bowel movement or phlebitis we will get  a CT scan to rule out bowel obstruction as he has multiple surgical sites they could potentially cause an SBO. Labs are reassuring. Lactate negative.  Pt's care transferred to Dr. Claretta Fraise at 4:07 PM, plan is to f/u CT and dispo accordingly.     Renne Musca, MD 01/11/15 [place], MD 01/12/15 [phone]

## 2015-02-18 ENCOUNTER — Other Ambulatory Visit (HOSPITAL_COMMUNITY): Payer: Self-pay | Admitting: Physician Assistant

## 2015-02-18 DIAGNOSIS — H538 Other visual disturbances: Secondary | ICD-10-CM

## 2015-02-19 ENCOUNTER — Ambulatory Visit (HOSPITAL_COMMUNITY)
Admission: RE | Admit: 2015-02-19 | Discharge: 2015-02-19 | Disposition: A | Discharge status: Home / Self Care | Payer: PRIVATE HEALTH INSURANCE | Source: Ambulatory Visit | Attending: Cardiovascular Disease | Admitting: Cardiovascular Disease

## 2015-02-19 DIAGNOSIS — H538 Other visual disturbances: Secondary | ICD-10-CM | POA: Diagnosis not present

## 2015-02-22 ENCOUNTER — Other Ambulatory Visit (HOSPITAL_COMMUNITY): Payer: Self-pay | Admitting: Physician Assistant

## 2015-03-10 ENCOUNTER — Other Ambulatory Visit: Payer: Self-pay | Admitting: General Surgery

## 2015-04-22 ENCOUNTER — Encounter (HOSPITAL_COMMUNITY)
Admission: RE | Admit: 2015-04-22 | Discharge: 2015-04-22 | Disposition: A | Discharge status: Home / Self Care | Payer: PRIVATE HEALTH INSURANCE | Source: Ambulatory Visit | Attending: General Surgery | Admitting: General Surgery

## 2015-04-22 ENCOUNTER — Encounter (HOSPITAL_COMMUNITY): Payer: Self-pay

## 2015-04-22 DIAGNOSIS — K432 Incisional hernia without obstruction or gangrene: Secondary | ICD-10-CM | POA: Insufficient documentation

## 2015-04-22 DIAGNOSIS — Z01812 Encounter for preprocedural laboratory examination: Secondary | ICD-10-CM | POA: Diagnosis present

## 2015-04-22 HISTORY — DX: Gastro-esophageal reflux disease without esophagitis: K21.9

## 2015-04-22 LAB — COMPREHENSIVE METABOLIC PANEL
ALBUMIN: 4.2 g/dL (ref 3.5–5.0)
ALT: 47 U/L (ref 17–63)
AST: 30 U/L (ref 15–41)
Alkaline Phosphatase: 61 U/L (ref 38–126)
Anion gap: 9 (ref 5–15)
BUN: 9 mg/dL (ref 6–20)
CHLORIDE: 108 mmol/L (ref 101–111)
CO2: 24 mmol/L (ref 22–32)
CREATININE: 0.97 mg/dL (ref 0.61–1.24)
Calcium: 9.1 mg/dL (ref 8.9–10.3)
GFR calc Af Amer: >60 mL/min (ref >60)
GLUCOSE: 109 mg/dL — AB (ref 65–99)
POTASSIUM: 4.4 mmol/L (ref 3.5–5.1)
SODIUM: 141 mmol/L (ref 135–145)
Total Bilirubin: 0.7 mg/dL (ref 0.3–1.2)
Total Protein: 6.7 g/dL (ref 6.5–8.1)

## 2015-04-22 LAB — CBC WITH DIFFERENTIAL/PLATELET
BASOS ABS: 0 10*3/uL (ref 0.0–0.1)
BASOS PCT: 1 %
EOS PCT: 1 %
Eosinophils Absolute: 0.1 10*3/uL (ref 0.0–0.7)
HCT: 39.5 % (ref 39.0–52.0)
Hemoglobin: 13.5 g/dL (ref 13.0–17.0)
LYMPHS PCT: 35 %
Lymphs Abs: 2.1 10*3/uL (ref 0.7–4.0)
MCH: 31.7 pg (ref 26.0–34.0)
MCHC: 34.2 g/dL (ref 30.0–36.0)
MCV: 92.7 fL (ref 78.0–100.0)
MONO ABS: 0.3 10*3/uL (ref 0.1–1.0)
Monocytes Relative: 5 %
Neutro Abs: 3.5 10*3/uL (ref 1.7–7.7)
Neutrophils Relative %: 58 %
PLATELETS: 241 10*3/uL (ref 150–400)
RBC: 4.26 MIL/uL (ref 4.22–5.81)
RDW: 11.7 % (ref 11.5–15.5)
WBC: 6.1 10*3/uL (ref 4.0–10.5)

## 2015-04-22 LAB — URINALYSIS, ROUTINE W REFLEX MICROSCOPIC
Bilirubin Urine: NEGATIVE
GLUCOSE, UA: NEGATIVE mg/dL
HGB URINE DIPSTICK: NEGATIVE
Ketones, ur: 15 mg/dL — AB
Leukocytes, UA: NEGATIVE
Nitrite: NEGATIVE
PROTEIN: NEGATIVE mg/dL
SPECIFIC GRAVITY, URINE: 1.021 (ref 1.005–1.030)
pH: 6.5 (ref 5.0–8.0)

## 2015-04-22 LAB — PROTIME-INR
INR: 0.96 (ref 0.00–1.49)
PROTHROMBIN TIME: 13 s (ref 11.6–15.2)

## 2015-04-22 NOTE — Pre-Procedure Instructions (Signed)
Mitchell Adams  04/22/2015      [facility] [place], [place] - [address] [place] [address] Phone: [phone] Fax: [phone]    Your procedure is scheduled on 04/29/2015  Report to [facility] Admitting at 5:30 A.M.  Call this number if you have problems the morning of surgery:  [phone]   Remember:  Do not eat food or drink liquids after midnight.   On Wednesday night    Take these medicines the morning of surgery with A SIP OF WATER  : may take tramadol day of surgery if needed    Do not wear jewelry   Do not wear lotions, powders, or perfumes.  You may wear deodorant.    Men may shave face & neck.    Do not bring valuables to the hospital.   [facility] is not responsible for any belongings or valuables.  Contacts, dentures or bridgework may not be worn into surgery.  Leave your suitcase in the car.  After surgery it may be brought to your room.  For patients admitted to the hospital, discharge time will be determined by your treatment team.  Patients discharged the day of surgery will not be allowed to drive home.   Name and phone number of your driver:  With partner  Special instructions:  Special Instructions: Mitchell Adams - Preparing for Surgery  Before surgery, you can play an important role.  Because skin is not sterile, your skin needs to be as free of germs as possible.  You can reduce the number of germs on you skin by washing with CHG (chlorahexidine gluconate) soap before surgery.  CHG is an antiseptic cleaner which kills germs and bonds with the skin to continue killing germs even after washing.  Please DO NOT use if you have an allergy to CHG or antibacterial soaps.  If your skin becomes reddened/irritated stop using the CHG and inform your nurse when you arrive at Short Stay.  Do not shave (including legs and underarms) for at least 48 hours prior to the first CHG shower.  You may  shave your face.  Please follow these instructions carefully:   1.  Shower with CHG Soap the night before surgery and the  morning of Surgery.  2.  If you choose to wash your hair, wash your hair first as usual with your  normal shampoo.  3.  After you shampoo, rinse your hair and body thoroughly to remove the  Shampoo.  4.  Use CHG as you would any other liquid soap.  You can apply chg directly to the skin and wash gently with scrungie or a clean washcloth.  5.  Apply the CHG Soap to your body ONLY FROM THE NECK DOWN.    Do not use on open wounds or open sores.  Avoid contact with your eyes, ears, mouth and genitals (private parts).  Wash genitals (private parts)   with your normal soap.  6.  Wash thoroughly, paying special attention to the area where your surgery will be performed.  7.  Thoroughly rinse your body with warm water from the neck down.  8.  DO NOT shower/wash with your normal soap after using and rinsing off   the CHG Soap.  9.  Pat yourself dry with a clean towel.            10.  Wear clean pajamas.  11.  Place clean sheets on your bed the night of your first shower and do not sleep with pets.  Day of Surgery  Do not apply any lotions/deodorants the morning of surgery.  Please wear clean clothes to the hospital/surgery center.  Please read over the following fact sheets that you were given. Pain Booklet, Coughing and Deep Breathing and Surgical Site Infection Prevention

## 2015-04-22 NOTE — Progress Notes (Addendum)
Pt. Reports that he had some chest pain 04/2014, seen in [facility] ED- EKG done& wnl , reports that he was told it was a pulled muscle. Pt. Denies any need for follow up.  Pt. Denies all chest concerns today, denies flulike symptoms.

## 2015-04-22 NOTE — Progress Notes (Signed)
Pt. Aware of bowel prep, pt. Aware not to take any blood thinning medicine, antiinflammatory or aspirin- from 12/31- forward.

## 2015-04-28 MED ORDER — CIPROFLOXACIN IN D5W 400 MG/200ML IV SOLN
400.0000 mg | INTRAVENOUS | Status: AC
  Administered 2015-04-29: 400 mg via INTRAVENOUS
  Filled 2015-04-28: qty 200

## 2015-04-28 MED ORDER — HEPARIN SODIUM (PORCINE) 5000 UNIT/ML IJ SOLN
5000.0000 [IU] | Freq: Once | INTRAMUSCULAR | Status: AC
Start: 2015-04-29 — End: 2015-04-29
  Administered 2015-04-29: 5000 [IU] via SUBCUTANEOUS
  Filled 2015-04-28: qty 1

## 2015-04-29 ENCOUNTER — Inpatient Hospital Stay (HOSPITAL_COMMUNITY)
Admission: RE | Admit: 2015-04-29 | Discharge: 2015-05-04 | DRG: 354 | Disposition: A | Discharge status: Home / Self Care | Payer: PRIVATE HEALTH INSURANCE | Source: Ambulatory Visit | Attending: General Surgery | Admitting: General Surgery

## 2015-04-29 ENCOUNTER — Inpatient Hospital Stay (HOSPITAL_COMMUNITY): Payer: PRIVATE HEALTH INSURANCE | Admitting: Certified Registered Nurse Anesthetist

## 2015-04-29 ENCOUNTER — Encounter (HOSPITAL_COMMUNITY)
Admission: RE | Disposition: A | Discharge status: Home / Self Care | Payer: Self-pay | Source: Ambulatory Visit | Attending: General Surgery

## 2015-04-29 ENCOUNTER — Encounter (HOSPITAL_COMMUNITY): Payer: Self-pay | Admitting: Certified Registered Nurse Anesthetist

## 2015-04-29 DIAGNOSIS — J9811 Atelectasis: Secondary | ICD-10-CM | POA: Diagnosis not present

## 2015-04-29 DIAGNOSIS — Z833 Family history of diabetes mellitus: Secondary | ICD-10-CM | POA: Diagnosis not present

## 2015-04-29 DIAGNOSIS — K219 Gastro-esophageal reflux disease without esophagitis: Secondary | ICD-10-CM | POA: Diagnosis present

## 2015-04-29 DIAGNOSIS — K660 Peritoneal adhesions (postprocedural) (postinfection): Secondary | ICD-10-CM | POA: Diagnosis present

## 2015-04-29 DIAGNOSIS — Z87891 Personal history of nicotine dependence: Secondary | ICD-10-CM

## 2015-04-29 DIAGNOSIS — K432 Incisional hernia without obstruction or gangrene: Secondary | ICD-10-CM | POA: Diagnosis present

## 2015-04-29 DIAGNOSIS — Z8249 Family history of ischemic heart disease and other diseases of the circulatory system: Secondary | ICD-10-CM

## 2015-04-29 HISTORY — PX: ABDOMINAL EXPLORATION SURGERY: SHX538

## 2015-04-29 HISTORY — PX: INCISIONAL HERNIA REPAIR: SHX193

## 2015-04-29 SURGERY — REPAIR, HERNIA, INCISIONAL
Anesthesia: General | Site: Abdomen

## 2015-04-29 MED ORDER — STERILE WATER FOR INJECTION IJ SOLN
INTRAMUSCULAR | Status: AC
  Filled 2015-04-29: qty 10

## 2015-04-29 MED ORDER — LIDOCAINE HCL (CARDIAC) 20 MG/ML IV SOLN
INTRAVENOUS | Status: DC | PRN
  Administered 2015-04-29: 50 mg via INTRAVENOUS

## 2015-04-29 MED ORDER — ACETAMINOPHEN 650 MG RE SUPP: 650.0000 mg | Freq: Four times a day (QID) | RECTAL | Status: DC | PRN

## 2015-04-29 MED ORDER — PROMETHAZINE HCL 25 MG/ML IJ SOLN: 6.2500 mg | INTRAMUSCULAR | Status: DC | PRN

## 2015-04-29 MED ORDER — ONDANSETRON HCL 4 MG/2ML IJ SOLN
INTRAMUSCULAR | Status: DC | PRN
  Administered 2015-04-29: 4 mg via INTRAVENOUS

## 2015-04-29 MED ORDER — KETOROLAC TROMETHAMINE 30 MG/ML IJ SOLN
30.0000 mg | Freq: Once | INTRAMUSCULAR | Status: AC
  Administered 2015-04-29: 30 mg via INTRAVENOUS

## 2015-04-29 MED ORDER — SODIUM CHLORIDE 0.9 % IV SOLN
INTRAVENOUS | Status: DC
  Administered 2015-04-29 – 2015-04-30 (×2): via INTRAVENOUS

## 2015-04-29 MED ORDER — LACTATED RINGERS IV SOLN
INTRAVENOUS | Status: DC | PRN
  Administered 2015-04-29 (×2): via INTRAVENOUS

## 2015-04-29 MED ORDER — MIDAZOLAM HCL 5 MG/5ML IJ SOLN
INTRAMUSCULAR | Status: DC | PRN
  Administered 2015-04-29: 2 mg via INTRAVENOUS

## 2015-04-29 MED ORDER — KETOROLAC TROMETHAMINE 30 MG/ML IJ SOLN
INTRAMUSCULAR | Status: AC
  Filled 2015-04-29: qty 1

## 2015-04-29 MED ORDER — ENOXAPARIN SODIUM 40 MG/0.4ML ~~LOC~~ SOLN
40.0000 mg | SUBCUTANEOUS | Status: DC
  Administered 2015-04-29 – 2015-05-03 (×5): 40 mg via SUBCUTANEOUS
  Filled 2015-04-29 (×5): qty 0.4

## 2015-04-29 MED ORDER — HYDROMORPHONE HCL 1 MG/ML IJ SOLN
INTRAMUSCULAR | Status: AC
  Filled 2015-04-29: qty 1

## 2015-04-29 MED ORDER — MIDAZOLAM HCL 2 MG/2ML IJ SOLN
INTRAMUSCULAR | Status: AC
  Filled 2015-04-29: qty 2

## 2015-04-29 MED ORDER — ONDANSETRON 4 MG PO TBDP: 4.0000 mg | ORAL_TABLET | Freq: Four times a day (QID) | ORAL | Status: DC | PRN

## 2015-04-29 MED ORDER — PROPOFOL 10 MG/ML IV BOLUS
INTRAVENOUS | Status: DC | PRN
  Administered 2015-04-29: 200 mg via INTRAVENOUS

## 2015-04-29 MED ORDER — DEXAMETHASONE SODIUM PHOSPHATE 10 MG/ML IJ SOLN
INTRAMUSCULAR | Status: DC | PRN
  Administered 2015-04-29: 10 mg via INTRAVENOUS

## 2015-04-29 MED ORDER — MORPHINE SULFATE 2 MG/ML IV SOLN
INTRAVENOUS | Status: AC
  Filled 2015-04-29: qty 25

## 2015-04-29 MED ORDER — OXYCODONE HCL 5 MG PO TABS: 5.0000 mg | ORAL_TABLET | Freq: Once | ORAL | Status: DC | PRN

## 2015-04-29 MED ORDER — 0.9 % SODIUM CHLORIDE (POUR BTL) OPTIME
TOPICAL | Status: DC | PRN
  Administered 2015-04-29: 1000 mL

## 2015-04-29 MED ORDER — ONDANSETRON HCL 4 MG/2ML IJ SOLN
INTRAMUSCULAR | Status: AC
  Filled 2015-04-29: qty 2

## 2015-04-29 MED ORDER — EPHEDRINE SULFATE 50 MG/ML IJ SOLN
INTRAMUSCULAR | Status: AC
  Filled 2015-04-29: qty 1

## 2015-04-29 MED ORDER — PHENYLEPHRINE HCL 10 MG/ML IJ SOLN
INTRAMUSCULAR | Status: DC | PRN
  Administered 2015-04-29: 40 ug via INTRAVENOUS
  Administered 2015-04-29 (×5): 80 ug via INTRAVENOUS

## 2015-04-29 MED ORDER — CIPROFLOXACIN IN D5W 400 MG/200ML IV SOLN
400.0000 mg | Freq: Two times a day (BID) | INTRAVENOUS | Status: AC
  Administered 2015-04-29 – 2015-04-30 (×2): 400 mg via INTRAVENOUS
  Filled 2015-04-29 (×2): qty 200

## 2015-04-29 MED ORDER — HYDROMORPHONE HCL 1 MG/ML IJ SOLN
0.2500 mg | INTRAMUSCULAR | Status: DC | PRN
  Administered 2015-04-29 (×2): 0.25 mg via INTRAVENOUS

## 2015-04-29 MED ORDER — SUGAMMADEX SODIUM 200 MG/2ML IV SOLN
INTRAVENOUS | Status: AC
  Filled 2015-04-29: qty 2

## 2015-04-29 MED ORDER — ONDANSETRON HCL 4 MG/2ML IJ SOLN: 4.0000 mg | Freq: Four times a day (QID) | INTRAMUSCULAR | Status: DC | PRN

## 2015-04-29 MED ORDER — DIPHENHYDRAMINE HCL 12.5 MG/5ML PO ELIX: 12.5000 mg | ORAL_SOLUTION | Freq: Four times a day (QID) | ORAL | Status: DC | PRN

## 2015-04-29 MED ORDER — ROCURONIUM BROMIDE 100 MG/10ML IV SOLN
INTRAVENOUS | Status: DC | PRN
  Administered 2015-04-29: 10 mg via INTRAVENOUS
  Administered 2015-04-29: 20 mg via INTRAVENOUS
  Administered 2015-04-29: 10 mg via INTRAVENOUS
  Administered 2015-04-29: 50 mg via INTRAVENOUS

## 2015-04-29 MED ORDER — MORPHINE SULFATE 2 MG/ML IV SOLN
INTRAVENOUS | Status: DC
  Administered 2015-04-29: 4.5 mg via INTRAVENOUS
  Administered 2015-04-29: 5 mg via INTRAVENOUS
  Administered 2015-04-29 – 2015-04-30 (×2): via INTRAVENOUS
  Administered 2015-04-30: 10 mg via INTRAVENOUS
  Administered 2015-04-30: 4.5 mg via INTRAVENOUS
  Administered 2015-04-30: 5.5 mg via INTRAVENOUS
  Administered 2015-04-30: 2 mg via INTRAVENOUS
  Administered 2015-04-30: 6 mg via INTRAVENOUS
  Administered 2015-04-30: 5 mg via INTRAVENOUS
  Administered 2015-05-01: 0 mg via INTRAVENOUS
  Administered 2015-05-01: 3 mg via INTRAVENOUS
  Filled 2015-04-29: qty 25

## 2015-04-29 MED ORDER — LIDOCAINE HCL (CARDIAC) 20 MG/ML IV SOLN
INTRAVENOUS | Status: AC
  Filled 2015-04-29: qty 5

## 2015-04-29 MED ORDER — EPHEDRINE SULFATE 50 MG/ML IJ SOLN
INTRAMUSCULAR | Status: DC | PRN
  Administered 2015-04-29: 5 mg via INTRAVENOUS

## 2015-04-29 MED ORDER — ARTIFICIAL TEARS OP OINT
TOPICAL_OINTMENT | OPHTHALMIC | Status: AC
  Filled 2015-04-29: qty 3.5

## 2015-04-29 MED ORDER — OXYCODONE HCL 5 MG/5ML PO SOLN: 5.0000 mg | Freq: Once | ORAL | Status: DC | PRN

## 2015-04-29 MED ORDER — SUGAMMADEX SODIUM 200 MG/2ML IV SOLN
INTRAVENOUS | Status: DC | PRN
  Administered 2015-04-29: 200 mg via INTRAVENOUS

## 2015-04-29 MED ORDER — PROPOFOL 10 MG/ML IV BOLUS
INTRAVENOUS | Status: AC
  Filled 2015-04-29: qty 20

## 2015-04-29 MED ORDER — NALOXONE HCL 0.4 MG/ML IJ SOLN: 0.4000 mg | INTRAMUSCULAR | Status: DC | PRN

## 2015-04-29 MED ORDER — PHENYLEPHRINE 40 MCG/ML (10ML) SYRINGE FOR IV PUSH (FOR BLOOD PRESSURE SUPPORT)
PREFILLED_SYRINGE | INTRAVENOUS | Status: AC
  Filled 2015-04-29: qty 10

## 2015-04-29 MED ORDER — ROCURONIUM BROMIDE 50 MG/5ML IV SOLN
INTRAVENOUS | Status: AC
  Filled 2015-04-29: qty 1

## 2015-04-29 MED ORDER — GLYCOPYRROLATE 0.2 MG/ML IJ SOLN
INTRAMUSCULAR | Status: AC
  Filled 2015-04-29: qty 3

## 2015-04-29 MED ORDER — SODIUM CHLORIDE 0.9 % IJ SOLN: 9.0000 mL | INTRAMUSCULAR | Status: DC | PRN

## 2015-04-29 MED ORDER — FENTANYL CITRATE (PF) 100 MCG/2ML IJ SOLN
INTRAMUSCULAR | Status: DC | PRN
  Administered 2015-04-29: 100 ug via INTRAVENOUS
  Administered 2015-04-29 (×2): 50 ug via INTRAVENOUS

## 2015-04-29 MED ORDER — PANTOPRAZOLE SODIUM 40 MG IV SOLR
40.0000 mg | Freq: Every day | INTRAVENOUS | Status: DC
  Administered 2015-04-29: 40 mg via INTRAVENOUS
  Filled 2015-04-29: qty 40

## 2015-04-29 MED ORDER — DIPHENHYDRAMINE HCL 50 MG/ML IJ SOLN: 12.5000 mg | Freq: Four times a day (QID) | INTRAMUSCULAR | Status: DC | PRN

## 2015-04-29 MED ORDER — SUCCINYLCHOLINE CHLORIDE 20 MG/ML IJ SOLN
INTRAMUSCULAR | Status: AC
  Filled 2015-04-29: qty 1

## 2015-04-29 MED ORDER — NEOSTIGMINE METHYLSULFATE 10 MG/10ML IV SOLN
INTRAVENOUS | Status: AC
  Filled 2015-04-29: qty 1

## 2015-04-29 MED ORDER — FENTANYL CITRATE (PF) 250 MCG/5ML IJ SOLN
INTRAMUSCULAR | Status: AC
  Filled 2015-04-29: qty 5

## 2015-04-29 MED ORDER — ACETAMINOPHEN 325 MG PO TABS
650.0000 mg | ORAL_TABLET | Freq: Four times a day (QID) | ORAL | Status: DC | PRN
  Administered 2015-04-30 – 2015-05-01 (×2): 650 mg via ORAL
  Filled 2015-04-29 (×2): qty 2

## 2015-04-29 SURGICAL SUPPLY — 44 items
BLADE SURG ROTATE 9660 (MISCELLANEOUS) IMPLANT
CANISTER SUCTION 2500CC (MISCELLANEOUS) ×2 IMPLANT
CHLORAPREP W/TINT 26ML (MISCELLANEOUS) ×2 IMPLANT
COVER SURGICAL LIGHT HANDLE (MISCELLANEOUS) ×2 IMPLANT
DEVICE TROCAR PUNCTURE CLOSURE (ENDOMECHANICALS) ×2 IMPLANT
DRAIN CHANNEL 19F RND (DRAIN) ×4 IMPLANT
DRAPE LAPAROSCOPIC ABDOMINAL (DRAPES) ×2 IMPLANT
DRSG OPSITE POSTOP 4X10 (GAUZE/BANDAGES/DRESSINGS) ×4 IMPLANT
ELECT CAUTERY BLADE 6.4 (BLADE) ×2 IMPLANT
ELECT REM PT RETURN 9FT ADLT (ELECTROSURGICAL) ×2
ELECTRODE REM PT RTRN 9FT ADLT (ELECTROSURGICAL) ×1 IMPLANT
EVACUATOR SILICONE 100CC (DRAIN) ×4 IMPLANT
GAUZE SPONGE 4X4 12PLY STRL (GAUZE/BANDAGES/DRESSINGS) ×2 IMPLANT
GLOVE BIO SURGEON STRL SZ 6.5 (GLOVE) ×4 IMPLANT
GLOVE BIO SURGEON STRL SZ7 (GLOVE) ×6 IMPLANT
GLOVE BIOGEL PI IND STRL 7.0 (GLOVE) ×2 IMPLANT
GLOVE BIOGEL PI IND STRL 7.5 (GLOVE) ×1 IMPLANT
GLOVE BIOGEL PI INDICATOR 7.0 (GLOVE) ×2
GLOVE BIOGEL PI INDICATOR 7.5 (GLOVE) ×1
GLOVE ORTHO TXT STRL SZ7.5 (GLOVE) ×2 IMPLANT
GOWN STRL REUS W/ TWL LRG LVL3 (GOWN DISPOSABLE) ×3 IMPLANT
GOWN STRL REUS W/TWL LRG LVL3 (GOWN DISPOSABLE) ×3
KIT BASIN OR (CUSTOM PROCEDURE TRAY) ×2 IMPLANT
KIT ROOM TURNOVER OR (KITS) ×2 IMPLANT
LIQUID BAND (GAUZE/BANDAGES/DRESSINGS) ×2 IMPLANT
MARKER SKIN DUAL TIP RULER LAB (MISCELLANEOUS) ×2 IMPLANT
MESH VENTRALIGHT ST 12X14IN (Mesh General) ×2 IMPLANT
NS IRRIG 1000ML POUR BTL (IV SOLUTION) ×2 IMPLANT
PACK GENERAL/GYN (CUSTOM PROCEDURE TRAY) ×2 IMPLANT
PAD ARMBOARD 7.5X6 YLW CONV (MISCELLANEOUS) ×2 IMPLANT
SPONGE GAUZE 4X4 12PLY STER LF (GAUZE/BANDAGES/DRESSINGS) ×2 IMPLANT
STAPLER VISISTAT 35W (STAPLE) ×4 IMPLANT
SUT ETHILON 2 0 FS 18 (SUTURE) ×4 IMPLANT
SUT NOV 1 T60/GS (SUTURE) IMPLANT
SUT NOVA NAB GS-21 0 18 T12 DT (SUTURE) IMPLANT
SUT PDS AB 2-0 CT1 27 (SUTURE) ×8 IMPLANT
SUT SILK 2 0 (SUTURE) ×1
SUT SILK 2-0 18XBRD TIE 12 (SUTURE) ×1 IMPLANT
SUT VIC AB 3-0 SH 18 (SUTURE) ×2 IMPLANT
TAPE CLOTH SURG 6X10 WHT LF (GAUZE/BANDAGES/DRESSINGS) ×2 IMPLANT
TOWEL OR 17X24 6PK STRL BLUE (TOWEL DISPOSABLE) ×2 IMPLANT
TOWEL OR 17X26 10 PK STRL BLUE (TOWEL DISPOSABLE) ×2 IMPLANT
TRAY FOLEY CATH 14FRSI W/METER (CATHETERS) IMPLANT
TUBE CONNECTING 12X1/4 (SUCTIONS) ×2 IMPLANT

## 2015-04-29 NOTE — Anesthesia Preprocedure Evaluation (Signed)
Anesthesia Evaluation  Patient identified by MRN, date of birth, ID band Patient awake    Reviewed: Allergy & Precautions, NPO status , Patient's Chart, lab work & pertinent test results  Airway Mallampati: II  TM Distance: >3 FB Neck ROM: Full    Dental  (+) Dental Advisory Given   Pulmonary former smoker,    breath sounds clear to auscultation       Cardiovascular negative cardio ROS   Rhythm:Regular Rate:Normal     Neuro/Psych negative neurological ROS     GI/Hepatic Neg liver ROS, GERD  ,  Endo/Other  negative endocrine ROS  Renal/GU negative Renal ROS     Musculoskeletal   Abdominal   Peds  Hematology negative hematology ROS (+)   Anesthesia Other Findings   Reproductive/Obstetrics                             Lab Results  Component Value Date   WBC 6.1 04/22/2015   HGB 13.5 04/22/2015   HCT 39.5 04/22/2015   MCV 92.7 04/22/2015   PLT 241 04/22/2015   Lab Results  Component Value Date   CREATININE 0.97 04/22/2015   BUN 9 04/22/2015   NA 141 04/22/2015   K 4.4 04/22/2015   CL 108 04/22/2015   CO2 24 04/22/2015    Anesthesia Physical Anesthesia Plan  ASA: II  Anesthesia Plan: General   Post-op Pain Management:    Induction: Intravenous  Airway Management Planned: Oral ETT  Additional Equipment:   Intra-op Plan:   Post-operative Plan: Extubation in OR  Informed Consent: I have reviewed the patients History and Physical, chart, labs and discussed the procedure including the risks, benefits and alternatives for the proposed anesthesia with the patient or authorized representative who has indicated his/her understanding and acceptance.   Dental advisory given  Plan Discussed with: CRNA  Anesthesia Plan Comments:         Anesthesia Quick Evaluation

## 2015-04-29 NOTE — Transfer of Care (Signed)
Immediate Anesthesia Transfer of Care Note  Patient: Mitchell Adams  Procedure(s) Performed: Procedure(s): OPEN INCISIONAL HERNIA REPAIR WITH MESH WITH MYOFACIAL RELEASE (N/A)  Patient Location: PACU  Anesthesia Type:General  Level of Consciousness: awake and patient cooperative  Airway & Oxygen Therapy: Patient Spontanous Breathing and Patient connected to nasal cannula oxygen  Post-op Assessment: Report given to RN, Post -op Vital signs reviewed and stable, Patient moving all extremities and Patient moving all extremities X 4  Post vital signs: Reviewed and stable  Last Vitals:  Filed Vitals:   04/29/15 0614 04/29/15 0958  BP: 108/95   Pulse: 68 71  Temp: 36.1 C 36.1 C  Resp: 20 15    Complications: No apparent anesthesia complications

## 2015-04-29 NOTE — Discharge Instructions (Signed)
[facility]      [facility] [phone]  OPEN ABDOMINAL SURGERY: POST OP INSTRUCTIONS  Always review your discharge instruction sheet given to you by the facility where your surgery was performed.  IF YOU HAVE DISABILITY OR FAMILY LEAVE FORMS, YOU MUST BRING THEM TO THE OFFICE FOR PROCESSING.  PLEASE DO NOT GIVE THEM TO YOUR DOCTOR.  1. A prescription for pain medication may be given to you upon discharge.  Take your pain medication as prescribed, if needed.  If narcotic pain medicine is not needed, then you may take acetaminophen (Tylenol) or ibuprofen (Advil) as needed. 2. Take your usually prescribed medications unless otherwise directed. 3. If you need a refill on your pain medication, please contact your pharmacy. They will contact our office to request authorization.  Prescriptions will not be filled after 5pm or on week-ends. 4. You should follow a light diet the first few days after arrival home, such as soup and crackers, pudding, etc.unless your doctor has advised otherwise. A high-fiber, low fat diet can be resumed as tolerated.   Be sure to include lots of fluids daily. Most patients will experience some swelling and bruising on the chest and neck area.  Ice packs will help.  Swelling and bruising can take several days to resolve 5. Most patients will experience some swelling and bruising in the area of the incision. Ice pack will help. Swelling and bruising can take several days to resolve..  6. It is common to experience some constipation if taking pain medication after surgery.  Increasing fluid intake and taking a stool softener will usually help or prevent this problem from occurring.  A mild laxative (Milk of Magnesia or Miralax) should be taken according to package directions if there are no bowel movements after 48 hours. 7.  You may have steri-strips (small skin tapes) in place directly over the incision.  These strips should be left on the skin for 7-10 days.  If your  surgeon used skin glue on the incision, you may shower in 24 hours.  The glue will flake off over the next 2-3 weeks.  Any sutures or staples will be removed at the office during your follow-up visit. You may find that a light gauze bandage over your incision may keep your staples from being rubbed or pulled. You may shower and replace the bandage daily. 8. ACTIVITIES:  You may resume regular (light) daily activities beginning the next day--such as daily self-care, walking, climbing stairs--gradually increasing activities as tolerated.  You may have sexual intercourse when it is comfortable.  Refrain from any heavy lifting or straining until approved by your doctor. a. You may drive when you no longer are taking prescription pain medication, you can comfortably wear a seatbelt, and you can safely maneuver your car and apply brakes b. Return to Work: ___________________________________ 9. You should see your doctor in the office for a follow-up appointment approximately two weeks after your surgery.  Make sure that you call for this appointment within a day or two after you arrive home to insure a convenient appointment time. OTHER INSTRUCTIONS:  _____________________________________________________________ _____________________________________________________________  WHEN TO CALL YOUR DOCTOR: 1. Fever over 101.0 2. Inability to urinate 3. Nausea and/or vomiting 4. Extreme swelling or bruising 5. Continued bleeding from incision. 6. Increased pain, redness, or drainage from the incision. 7. Difficulty swallowing or breathing 8. Muscle cramping or spasms. 9. Numbness or tingling in hands or feet or around lips.  The clinic staff is available to   answer your questions during regular business hours.  Please don't hesitate to call and ask to speak to one of the nurses if you have concerns.  For further questions, please visit [email or web address]   

## 2015-04-29 NOTE — Care Management Note (Signed)
Case Management Note  Patient Details  Name: Cortrell Angelo MRN: [id number] Date of Birth: 1960-10-24  Subjective/Objective:                    Action/Plan:  Initial UR completed  Expected Discharge Date:                  Expected Discharge Plan:  Home/Self Care  In-House Referral:     Discharge planning Services     Post Acute Care Choice:    Choice offered to:     DME Arranged:    DME Agency:     HH Arranged:    [place] Agency:     Status of Service:  In process, will continue to follow  Medicare Important Message Given:    Date Medicare IM Given:    Medicare IM give by:    Date Additional Medicare IM Given:    Additional Medicare Important Message give by:     If discussed at [place] of Stay Meetings, dates discussed:    Additional Comments:  Marilu Favre, RN 04/29/2015, 4:02 PM

## 2015-04-29 NOTE — Op Note (Signed)
Preoperative diagnosis: Incisional hernia 2 Postoperative diagnosis: Same as above Procedure: #1 exploratory laparotomy with lysis of adhesions 20 minutes #2 bilateral transverse abdominis release (TAR) for abdominal wall closure #3 pre-peritoneal incisional hernia repair with 25 x 33 cm ventralight ST mesh Surgeon: Dr. Serita Grammes Asst.: Dr. Nedra Hai Estimated blood loss: 50 mL Anesthesia Gen. Drains: 70 French Blake drains above the mesh Sponge count was correct at completion Specimens: None Complications: None Disposition to recovery stable  Indications: This is a 55 year old male who previously has had laparotomy for colon perforation. He has a 7 cm supraumbilical hernia and another hernia near umbilicus.  We discussed options and decided to proceed with open repair with mesh with likely TAR.  Procedure: After informed consent was obtained the patient was taken to the operating first an epidural placed with anesthesia. He was given vancomycin due to his penicillin allergy. He had sequential compression devices on his legs. He was then placed under general anesthesia without complication. Foley catheter and orogastric tube were placed. His abdomen was then prepped and draped in the standard sterile surgical fashion. A surgical timeout was then performed.  I opened his prior incision. He was noted to have superior to the umbilicus about a 7 cm defect. There was another small hernia at the umbilicus. I then spent about 20 minutes lysing multiple adhesions from his omentum, small bowel, and colon to his abdominal wall.  There were no enterotomies. Once I had done this I then incised my posterior rectus sheath on both sides. I then entered into the posterior rectus space and traveled out laterally. I wanted to place a larger piece of mesh and bring his abdominal wall together in the midline so I elected to do a transversus abdominis release. I also wanted to cover his entire prior  incision that went from xyphoid to pubis with mesh. About a centimeter into the transversus abdominis I incised this from the superiormost aspect of his incision down inferiorly towards the pubis. I was able to then create the flaps to get into the anterior superior iliac spine in both sides. I then created the superior space to the xiphoid process. Inferiorly I entered the preperitoneal space all the way down to his pelvis. Once I developed the space I then looked intra-abdominally again. This was hemostatic and the bowel was all fine. I then closed the peritoneum with a running 3-0 pds suture. Once I closed this I measured and then I used a 25 x 33 cm ventralight ST mesh and placed this in the preperitoneal space. This completely covered the defect and laid it flat. I placed 4 #1 novafil sutures around the mesh. I then used the Endo Close device to pull these up and to secure the mesh in position. Once I had done this I placed 2 19 French Blake drains overlying the mesh. I then proceeded to close the fascia with 2-0 PDS suture. I secured the drain with a #1 nylon. The skin was closed with staples. I glued the suture sites together. He tolerated this well was extubated and transferred to the recovery room in stable condition.

## 2015-04-29 NOTE — Anesthesia Procedure Notes (Signed)
Procedure Name: Intubation Date/Time: 04/29/2015 7:43 AM Performed by: Shirlyn Goltz Pre-anesthesia Checklist: Patient identified, Emergency Drugs available, Patient being monitored and Suction available Patient Re-evaluated:Patient Re-evaluated prior to inductionOxygen Delivery Method: Circle system utilized Preoxygenation: Pre-oxygenation with 100% oxygen Intubation Type: IV induction Ventilation: Mask ventilation without difficulty Laryngoscope Size: Mac and 3 Grade View: Grade IV Tube type: Oral Tube size: 7.0 mm Number of attempts: 1 Airway Equipment and Method: Bougie stylet Placement Confirmation: ETT inserted through vocal cords under direct vision,  positive ETCO2 and breath sounds checked- equal and bilateral Secured at: 22 cm Tube secured with: Tape Dental Injury: Teeth and Oropharynx as per pre-operative assessment

## 2015-04-29 NOTE — Anesthesia Postprocedure Evaluation (Signed)
Anesthesia Post Note  Patient: Mitchell Adams  Procedure(s) Performed: Procedure(s) (LRB): OPEN INCISIONAL HERNIA REPAIR WITH MESH WITH MYOFACIAL RELEASE (N/A)  Patient location during evaluation: PACU Anesthesia Type: General Level of consciousness: awake and alert Pain management: pain level controlled Vital Signs Assessment: post-procedure vital signs reviewed and stable Respiratory status: spontaneous breathing Cardiovascular status: blood pressure returned to baseline Anesthetic complications: no    Last Vitals:  Filed Vitals:   04/29/15 1100 04/29/15 1115  BP: 128/75 113/80  Pulse: 74 75  Temp:  36.6 C  Resp: 16 14    Last Pain:  Filed Vitals:   04/29/15 1116  PainSc: Asleep                 Tiajuana Amass

## 2015-04-29 NOTE — H&P (Signed)
  44 yom with prior abdominal surgery in [place] who had colonscopy with several polyps removed. he then underwent laparotomy for perforation with repair of that. no colectomy done or colostomy. in hospital four days and then discharged. has done well until a year ago when he developed abdominal bulge. he has been to er due to pain with a 7 cm hernia and a second area about 2 cm in size. he has discomfort with this.   Other Problems  Back Pain Cerebrovascular Accident Gastroesophageal Reflux Disease Ventral Hernia Repair  Past Surgical History Colon Polyp Removal - Colonoscopy  Diagnostic Studies History Colonoscopy 1-5 years ago  Allergies No Known Drug Allergies09/30/2016  Medication History  No Current Medications Medications Reconciled  Social History  Alcohol use Occasional alcohol use. Caffeine use Coffee, Tea. Illicit drug use Remotely quit drug use. Tobacco use Former smoker.  Family History  Arthritis Mother. Diabetes Mellitus Mother. Hypertension Mother.  Review of Systems  General Present- Appetite Loss, Fatigue and Weight Gain. Not Present- Chills, Fever, Night Sweats and Weight Loss. Skin Present- Dryness. Not Present- Change in Wart/Mole, Hives, Jaundice, New Lesions, Non-Healing Wounds, Rash and Ulcer. HEENT Present- Seasonal Allergies and Wears glasses/contact lenses. Not Present- Earache, Hearing Loss, Hoarseness, Nose Bleed, Oral Ulcers, Ringing in the Ears, Sinus Pain, Sore Throat, Visual Disturbances and Yellow Eyes. Respiratory Present- Snoring. Not Present- Bloody sputum, Chronic Cough, Difficulty Breathing and Wheezing. Cardiovascular Present- Shortness of Breath. Not Present- Chest Pain, Difficulty Breathing Lying Down, Leg Cramps, Palpitations, Rapid Heart Rate and Swelling of Extremities. Gastrointestinal Present- Abdominal Pain, Change in Bowel Habits, Gets full quickly at meals and Nausea. Not Present- Bloating, Bloody Stool, Chronic  diarrhea, Constipation, Difficulty Swallowing, Excessive gas, Hemorrhoids, Indigestion, Rectal Pain and Vomiting. Male Genitourinary Present- Impotence. Not Present- Blood in Urine, Change in Urinary Stream, Frequency, Nocturia, Painful Urination, Urgency and Urine Leakage. Musculoskeletal Present- Joint Pain and Joint Stiffness. Not Present- Back Pain, Muscle Pain, Muscle Weakness and Swelling of Extremities. Neurological Present- Headaches, Trouble walking and Weakness. Not Present- Decreased Memory, Fainting, Numbness, Seizures, Tingling and Tremor. Psychiatric Not Present- Anxiety, Bipolar, Change in Sleep Pattern, Depression, Fearful and Frequent crying. Endocrine Not Present- Cold Intolerance, Excessive Hunger, Hair Changes, Heat Intolerance, Hot flashes and New Diabetes. Hematology Not Present- Easy Bruising, Excessive bleeding, Gland problems, HIV and Persistent Infections.  Vitals  Weight: 184 lb Height: 67in Body Surface Area: 1.99 m Body Mass Index: 28.82 kg/m  Temp.: 91F(Temporal)  Pulse: 81 (Regular)  BP: 132/88 (Sitting, Left Arm, Standard) Physical Exam General Mental Status-Alert. Orientation-Oriented X3. Chest and Lung Exam Chest and lung exam reveals -on auscultation, normal breath sounds, no adventitious sounds and normal vocal resonance. Cardiovascular Cardiovascular examination reveals -normal heart sounds, regular rate and rhythm with no murmurs. Abdomen Abdomen Note: soft mild tender hernia, he has at least 7 cm incisional hernia with what feel like two smaller areas superior and inferior to the larger hernia   Assessment & Plan  INCISIONAL HERNIA (K43.2) Story: I discussed open preperitoneal hernia repair with mesh with possible tar release to bring rectus muscles together. risks include but not limited to bleeding, infection, reoperation, recurrence, dvt/pe, stroke. he understands hospital stay, recovery and time ouf of work. we had long  discussion today about the procedure and description of mesh and flap closure.

## 2015-04-29 NOTE — Interval H&P Note (Signed)
History and Physical Interval Note:  04/29/2015 7:18 AM  Mitchell Adams  has presented today for surgery, with the diagnosis of INCISIONAL HERNIA  The various methods of treatment have been discussed with the patient and family. After consideration of risks, benefits and other options for treatment, the patient has consented to  Procedure(s): OPEN INCISIONAL HERNIA REPAIR WITH MESH AND POSSIBLE MYOFACIAL RELEASE (N/A) as a surgical intervention .  The patient's history has been reviewed, patient examined, no change in status, stable for surgery.  I have reviewed the patient's chart and labs.  Questions were answered to the patient's satisfaction.     Srishti Strnad

## 2015-04-29 NOTE — Interval H&P Note (Signed)
History and Physical Interval Note:  04/29/2015 9:56 AM  Mitchell Adams  has presented today for surgery, with the diagnosis of INCISIONAL HERNIA  The various methods of treatment have been discussed with the patient and family. After consideration of risks, benefits and other options for treatment, the patient has consented to  Procedure(s): OPEN INCISIONAL HERNIA REPAIR WITH MESH WITH MYOFACIAL RELEASE (N/A) as a surgical intervention .  The patient's history has been reviewed, patient examined, no change in status, stable for surgery.  I have reviewed the patient's chart and labs.  Questions were answered to the patient's satisfaction.     Sharhonda Atwood

## 2015-04-29 NOTE — Progress Notes (Signed)
Pt transferred to [facility]; PCA  Pt hx cleared (6mg  morphine)

## 2015-04-30 ENCOUNTER — Encounter (HOSPITAL_COMMUNITY): Payer: Self-pay | Admitting: General Surgery

## 2015-04-30 LAB — CBC
HCT: 36.7 % — ABNORMAL LOW (ref 39.0–52.0)
HEMOGLOBIN: 12.2 g/dL — AB (ref 13.0–17.0)
MCH: 31.6 pg (ref 26.0–34.0)
MCHC: 33.2 g/dL (ref 30.0–36.0)
MCV: 95.1 fL (ref 78.0–100.0)
Platelets: 199 10*3/uL (ref 150–400)
RBC: 3.86 MIL/uL — AB (ref 4.22–5.81)
RDW: 12 % (ref 11.5–15.5)
WBC: 10.7 10*3/uL — ABNORMAL HIGH (ref 4.0–10.5)

## 2015-04-30 LAB — BASIC METABOLIC PANEL
ANION GAP: 8 (ref 5–15)
BUN: 11 mg/dL (ref 6–20)
CO2: 25 mmol/L (ref 22–32)
CREATININE: 0.83 mg/dL (ref 0.61–1.24)
Calcium: 8.7 mg/dL — ABNORMAL LOW (ref 8.9–10.3)
Chloride: 106 mmol/L (ref 101–111)
GFR calc Af Amer: >60 mL/min (ref >60)
GFR calc non Af Amer: >60 mL/min (ref >60)
GLUCOSE: 102 mg/dL — AB (ref 65–99)
POTASSIUM: 4.3 mmol/L (ref 3.5–5.1)
SODIUM: 139 mmol/L (ref 135–145)

## 2015-04-30 MED ORDER — PANTOPRAZOLE SODIUM 40 MG IV SOLR
40.0000 mg | Freq: Two times a day (BID) | INTRAVENOUS | Status: DC
  Administered 2015-04-30 – 2015-05-03 (×7): 40 mg via INTRAVENOUS
  Filled 2015-04-30 (×6): qty 40

## 2015-04-30 MED ORDER — KCL IN DEXTROSE-NACL 20-5-0.45 MEQ/L-%-% IV SOLN
INTRAVENOUS | Status: DC
  Administered 2015-04-30 – 2015-05-02 (×6): via INTRAVENOUS
  Filled 2015-04-30 (×7): qty 1000

## 2015-04-30 MED ORDER — KETOROLAC TROMETHAMINE 15 MG/ML IJ SOLN
15.0000 mg | Freq: Three times a day (TID) | INTRAMUSCULAR | Status: DC | PRN
  Administered 2015-04-30 – 2015-05-01 (×2): 15 mg via INTRAVENOUS
  Filled 2015-04-30 (×2): qty 1

## 2015-04-30 NOTE — Progress Notes (Signed)
1 Day Post-Op  Subjective: Pain controlled, no flatus, no nausea  Objective: Vital signs in last 24 hours: Temp:  [97 F (36.1 C)-99.1 F (37.3 C)] 99.1 F (37.3 C) (01/06 0509) Pulse Rate:  [67-80] 67 (01/06 0509) Resp:  [13-21] 16 (01/06 0509) BP: (111-131)/(73-85) 122/74 mmHg (01/06 0509) SpO2:  [93 %-100 %] 100 % (01/06 0509) Weight:  [86.5 kg (190 lb 11.2 oz)] 86.5 kg (190 lb 11.2 oz) (01/05 1900) Last BM Date: 04/28/15  Intake/Output from previous day: 01/05 0701 - 01/06 0700 In: 3571 [P.O.:600; I.V.:2971] Out: 2315 [Urine:2000; Drains:215; Blood:100] Intake/Output this shift: Total I/O In: -  Out: 500 [Urine:500]  General appearance: no distress Resp: clear to auscultation bilaterally Cardio: regular rate and rhythm GI: dressing intact and dry, drains serosang as expected no bs approp tender   Anti-infectives: Anti-infectives    Start     Dose/Rate Route Frequency Ordered Stop   04/29/15 2000  ciprofloxacin (CIPRO) IVPB 400 mg     400 mg 200 mL/hr over 60 Minutes Intravenous Every 12 hours 04/29/15 1509 04/30/15 1959   04/29/15 0700  ciprofloxacin (CIPRO) IVPB 400 mg     400 mg 200 mL/hr over 60 Minutes Intravenous To [facility] 04/28/15 1406 04/29/15 0745      Assessment/Plan: POD 1 open incisional hernia repair with mesh/tar  1. Continue pca, add toradol 2l pulm toilet, oob today 3. Dc foley, check bmet 4. Cont clears until some bowel function 5. Lovenox, scds  [facility] 04/30/2015

## 2015-05-01 MED ORDER — OXYCODONE-ACETAMINOPHEN 5-325 MG PO TABS: 1.0000 | ORAL_TABLET | ORAL | Status: DC | PRN

## 2015-05-01 NOTE — Progress Notes (Signed)
Patient ID: Mitchell Adams, male   DOB: May 16, 1960, 55 y.o.   MRN: [id number] 2 Days Post-Op  Subjective: Says he feels much better today. Having less pain and has not been using PCA since last night. No flatus or bowel movements yet. A lot of belching and a little reflux but no nausea or vomiting.  Objective: Vital signs in last 24 hours: Temp:  [98.5 F (36.9 C)-102.3 F (39.1 C)] 98.8 F (37.1 C) (01/07 0532) Pulse Rate:  [74-93] 91 (01/07 0532) Resp:  [14-28] 21 (01/07 0814) BP: (105-127)/(64-79) 105/64 mmHg (01/07 0532) SpO2:  [94 %-99 %] 97 % (01/07 0814) Last BM Date: 04/28/15  Intake/Output from previous day: 01/06 0701 - 01/07 0700 In: 2841.7 [P.O.:480; I.V.:2361.7] Out: 2155 [Urine:1900; Drains:255] Intake/Output this shift:    General appearance: alert, cooperative and no distress Resp: clear anteriorly GI: moderately distended and tympanitic but nontender. Incision/Wound:dressing clean and dry. No erythema. JP drainage serosanguineous.  Lab Results:   Recent Labs  04/30/15 0800  WBC 10.7*  HGB 12.2*  HCT 36.7*  PLT 199   BMET  Recent Labs  04/30/15 0800  NA 139  K 4.3  CL 106  CO2 25  GLUCOSE 102*  BUN 11  CREATININE 0.83  CALCIUM 8.7*     Studies/Results: No results found.  Anti-infectives: Anti-infectives    Start     Dose/Rate Route Frequency Ordered Stop   04/29/15 2000  ciprofloxacin (CIPRO) IVPB 400 mg     400 mg 200 mL/hr over 60 Minutes Intravenous Every 12 hours 04/29/15 1509 04/30/15 0923   04/29/15 0700  ciprofloxacin (CIPRO) IVPB 400 mg     400 mg 200 mL/hr over 60 Minutes Intravenous To [facility] 04/28/15 1406 04/29/15 0745      Assessment/Plan: s/p Procedure(s): OPEN INCISIONAL HERNIA REPAIR WITH MESH WITH MYOFACIAL RELEASE Stable. Some continued ileus. Continue clear liquids today and ambulation encouraged. Discontinue PCA and start oral pain meds Fever spike last night, very likely secondary to atelectasis.  Pulmonary toilet encouraged. Check CBC in a.m.   LOS: 2 days    Carliss Quast T 05/01/2015

## 2015-05-01 NOTE — Progress Notes (Signed)
Patient spiked a fever of 102.3 during night. Tylenol was given and Incentive was encouraged. Temp did drop to 99.6 then 98.5 during the hours after tylenol. Continued to encourage ambulation and Incentive. Will continue to monitor. Jimmie Molly, RN

## 2015-05-02 LAB — CBC
HEMATOCRIT: 36.9 % — AB (ref 39.0–52.0)
Hemoglobin: 12 g/dL — ABNORMAL LOW (ref 13.0–17.0)
MCH: 31.2 pg (ref 26.0–34.0)
MCHC: 32.5 g/dL (ref 30.0–36.0)
MCV: 95.8 fL (ref 78.0–100.0)
Platelets: 205 10*3/uL (ref 150–400)
RBC: 3.85 MIL/uL — ABNORMAL LOW (ref 4.22–5.81)
RDW: 11.7 % (ref 11.5–15.5)
WBC: 8.7 10*3/uL (ref 4.0–10.5)

## 2015-05-02 MED ORDER — BISACODYL 10 MG RE SUPP
10.0000 mg | Freq: Once | RECTAL | Status: AC
  Administered 2015-05-02: 10 mg via RECTAL
  Filled 2015-05-02: qty 1

## 2015-05-02 NOTE — Progress Notes (Signed)
Patient ID: Mitchell Adams, male   DOB: 04-16-1961, 55 y.o.   MRN: [id number] 3 Days Post-Op  Subjective: Complaining of "gas" with pressure and bloating and some cramping. No flatus yet. No nausea or vomiting. Drinking some clear liquids. Working with incentive spirometer and has coughed up some mucus.  Objective: Vital signs in last 24 hours: Temp:  [98.7 F (37.1 C)-101 F (38.3 C)] 99.7 F (37.6 C) (01/08 0525) Pulse Rate:  [93-115] 106 (01/08 0525) Resp:  [18-20] 18 (01/08 0525) BP: (100-140)/(68-84) 112/78 mmHg (01/08 0525) SpO2:  [91 %-98 %] 97 % (01/08 0525) Last BM Date: 04/28/15  Intake/Output from previous day: 01/07 0701 - 01/08 0700 In: 2409.1 [P.O.:1140; I.V.:1269.1] Out: [id number]; Drains:70] Intake/Output this shift:    General appearance: alert, cooperative and no distress Resp: no wheezing or increased work of breathing GI: distended and tympanitic  but nontender Incision/Wound:dressing intact without evidence of infection. JP drainage serosanguineous.  Lab Results:   Recent Labs  04/30/15 0800 05/02/15 0533  WBC 10.7* 8.7  HGB 12.2* 12.0*  HCT 36.7* 36.9*  PLT 199 205   BMET  Recent Labs  04/30/15 0800  NA 139  K 4.3  CL 106  CO2 25  GLUCOSE 102*  BUN 11  CREATININE 0.83  CALCIUM 8.7*     Studies/Results: No results found.  Anti-infectives: Anti-infectives    Start     Dose/Rate Route Frequency Ordered Stop   04/29/15 2000  ciprofloxacin (CIPRO) IVPB 400 mg     400 mg 200 mL/hr over 60 Minutes Intravenous Every 12 hours 04/29/15 1509 04/30/15 0923   04/29/15 0700  ciprofloxacin (CIPRO) IVPB 400 mg     400 mg 200 mL/hr over 60 Minutes Intravenous To [facility] 04/28/15 1406 04/29/15 0745      Assessment/Plan: s/p Procedure(s): OPEN INCISIONAL HERNIA REPAIR WITH MESH WITH MYOFACIAL RELEASE Postoperative ileus. Continue liquids only. Continue ambulation. Try Dulcolax suppository today. Fever spike again last night  though lower, 101. WBC normal. Very likely atelectasis. Continue pulmonary toilet.    LOS: 3 days    Damarko Stitely T 05/02/2015

## 2015-05-03 MED ORDER — PANTOPRAZOLE SODIUM 40 MG PO TBEC
40.0000 mg | DELAYED_RELEASE_TABLET | Freq: Two times a day (BID) | ORAL | Status: DC
  Administered 2015-05-03: 40 mg via ORAL
  Filled 2015-05-03: qty 1

## 2015-05-03 MED ORDER — NAPROXEN 250 MG PO TABS: 250.0000 mg | ORAL_TABLET | Freq: Two times a day (BID) | ORAL | Status: DC | PRN

## 2015-05-03 MED ORDER — DOCUSATE SODIUM 100 MG PO CAPS
100.0000 mg | ORAL_CAPSULE | Freq: Two times a day (BID) | ORAL | Status: DC
Start: 2015-05-03 — End: 2015-05-04
  Administered 2015-05-03 (×2): 100 mg via ORAL
  Filled 2015-05-03 (×2): qty 1

## 2015-05-03 NOTE — Progress Notes (Signed)
4 Days Post-Op  Subjective: Passing flatus, tol clears, no pain, ambulating, voiding fine  Objective: Vital signs in last 24 hours: Temp:  [98.4 F (36.9 C)-98.8 F (37.1 C)] 98.8 F (37.1 C) (01/09 0502) Pulse Rate:  [91-100] 91 (01/09 0502) Resp:  [18-19] 19 (01/09 0502) BP: (120-126)/(68-76) 120/68 mmHg (01/09 0502) SpO2:  [99 %] 99 % (01/09 0502) Last BM Date: 04/28/15  Intake/Output from previous day: 01/08 0701 - 01/09 0700 In: 2570 [P.O.:770; I.V.:1800] Out: 1935 [Urine:1800; Drains:135] Intake/Output this shift: Total I/O In: -  Out: 550 [Urine:550]  General appearance: no distress GI: wound clean without infection, staples in place bs present drains serous approp tender  Lab Results:   Recent Labs  05/02/15 0533  WBC 8.7  HGB 12.0*  HCT 36.9*  PLT 205    Anti-infectives: Anti-infectives    Start     Dose/Rate Route Frequency Ordered Stop   04/29/15 2000  ciprofloxacin (CIPRO) IVPB 400 mg     400 mg 200 mL/hr over 60 Minutes Intravenous Every 12 hours 04/29/15 1509 04/30/15 0923   04/29/15 0700  ciprofloxacin (CIPRO) IVPB 400 mg     400 mg 200 mL/hr over 60 Minutes Intravenous To [facility] 04/28/15 1406 04/29/15 0745      Assessment/Plan: POD 4 open incisional hernia repair with mesh, tar  1. Po pain meds 2. pulm toilet 3. Advance diet 4. Lovenox, scds 5. Home tomorrow if tol diet today  [facility] 05/03/2015

## 2015-05-04 MED ORDER — OXYCODONE HCL 5 MG PO TABS: 5.0000 mg | ORAL_TABLET | Freq: Four times a day (QID) | ORAL | Status: DC | PRN

## 2015-05-04 MED ORDER — DOCUSATE SODIUM 100 MG PO CAPS: 100.0000 mg | ORAL_CAPSULE | Freq: Two times a day (BID) | ORAL | Status: DC

## 2015-05-04 NOTE — Discharge Summary (Signed)
Physician Discharge Summary  Patient ID: Mitchell Adams MRN: [id number] DOB/AGE: [birth year] 54 y.o.  Admit date: 04/29/2015 Discharge date: 05/04/2015  Admission Diagnoses: Incisional hernia Discharge Diagnoses:  Active Problems:   Incisional hernia   Discharged Condition: good  Hospital Course: 24 yom with symptomatic incisional hernia that underwent open incisional hernia repair with preperitoneal mesh and TAR.  He had postop ileus which has resolved. Drains have been serous. His wound has remained clean.  He is ambulating. He began having bowel function and is tolerating a regular diet now.    Consults: None  Significant Diagnostic Studies: none  Treatments: surgery: open incisional hernia repair with mesh, TAR    Disposition: 01-Home or Self Care     Medication List    TAKE these medications        alum hydroxide-mag trisilicate [id number] MG Chew chewable tablet  Commonly known as:  GAVISCON  Chew 2 tablets by mouth.     docusate sodium 100 MG capsule  Commonly known as:  COLACE  Take 1 capsule (100 mg total) by mouth 2 (two) times daily.     ibuprofen 400 MG tablet  Commonly known as:  ADVIL,MOTRIN  Take 400 mg by mouth every 6 (six) hours as needed for headache.     oxyCODONE 5 MG immediate release tablet  Commonly known as:  Oxy IR/ROXICODONE  Take 1-2 tablets (5-10 mg total) by mouth every 6 (six) hours as needed for moderate pain, severe pain or breakthrough pain.     traMADol 50 MG tablet  Commonly known as:  ULTRAM  Take 50 mg by mouth every 6 (six) hours as needed for moderate pain.           Follow-up Information    Follow up with [facility], MD In 1 week.   Specialty:  General Surgery   Contact information:   [address] [place] [address] [phone]       Signed: Rolm Bookbinder 05/04/2015, 9:44 AM

## 2015-05-04 NOTE — Progress Notes (Signed)
Discussed discharge summary with patient. Reviewed all medications with patient. Patient received Rx. Patient ready for discharge. 

## 2015-05-04 NOTE — Progress Notes (Signed)
5 Days Post-Op  Subjective: tol diet, had bm, passing flatus, ambulating, ready to go home  Objective: Vital signs in last 24 hours: Temp:  [98.4 F (36.9 C)-98.6 F (37 C)] 98.6 F (37 C) (01/10 0434) Pulse Rate:  [84-100] 84 (01/10 0434) Resp:  [17-19] 17 (01/10 0434) BP: (107-113)/(67-76) 110/76 mmHg (01/10 0434) SpO2:  [99 %] 99 % (01/10 0434) Last BM Date: 05/03/15  Intake/Output from previous day: 01/09 0701 - 01/10 0700 In: 240 [P.O.:240] Out: 1178 [Urine:1100; Drains:78] Intake/Output this shift: Total I/O In: -  Out: 328 [Urine:250; Drains:78]  General appearance: no distress Resp: clear to auscultation bilaterally Cardio: regular rate and rhythm GI: soft approp tender incision clean without infection drains serous  Lab Results:   Recent Labs  05/02/15 0533  WBC 8.7  HGB 12.0*  HCT 36.9*  PLT 205   BMET No results for input(s): NA, K, CL, CO2, GLUCOSE, BUN, CREATININE, CALCIUM in the last 72 hours. PT/INR No results for input(s): LABPROT, INR in the last 72 hours. ABG No results for input(s): PHART, HCO3 in the last 72 hours.  Invalid input(s): PCO2, PO2  Studies/Results: No results found.  Anti-infectives: Anti-infectives    Start     Dose/Rate Route Frequency Ordered Stop   04/29/15 2000  ciprofloxacin (CIPRO) IVPB 400 mg     400 mg 200 mL/hr over 60 Minutes Intravenous Every 12 hours 04/29/15 1509 04/30/15 0923   04/29/15 0700  ciprofloxacin (CIPRO) IVPB 400 mg     400 mg 200 mL/hr over 60 Minutes Intravenous To [facility] 04/28/15 1406 04/29/15 0745      Assessment/Plan: POD 5 open IH repair with mesh, tar  Dc home today Drains out and staples out later this week or early next week [facility] 05/04/2015

## 2015-06-17 ENCOUNTER — Ambulatory Visit
Admission: RE | Admit: 2015-06-17 | Discharge: 2015-06-17 | Disposition: A | Discharge status: Home / Self Care | Payer: BLUE CROSS/BLUE SHIELD | Source: Ambulatory Visit | Attending: General Surgery | Admitting: General Surgery

## 2015-06-17 ENCOUNTER — Other Ambulatory Visit: Payer: Self-pay | Admitting: General Surgery

## 2015-06-17 DIAGNOSIS — R1084 Generalized abdominal pain: Secondary | ICD-10-CM

## 2015-06-17 MED ORDER — IOPAMIDOL (ISOVUE-300) INJECTION 61%
100.0000 mL | Freq: Once | INTRAVENOUS | Status: AC | PRN
  Administered 2015-06-17: 100 mL via INTRAVENOUS

## 2015-06-25 ENCOUNTER — Other Ambulatory Visit: Payer: Self-pay | Admitting: General Surgery

## 2015-06-25 DIAGNOSIS — R1011 Right upper quadrant pain: Secondary | ICD-10-CM

## 2015-07-05 ENCOUNTER — Ambulatory Visit
Admission: RE | Admit: 2015-07-05 | Discharge: 2015-07-05 | Disposition: A | Discharge status: Home / Self Care | Payer: BLUE CROSS/BLUE SHIELD | Source: Ambulatory Visit | Attending: General Surgery | Admitting: General Surgery

## 2015-07-05 DIAGNOSIS — R1011 Right upper quadrant pain: Secondary | ICD-10-CM

## 2015-07-08 ENCOUNTER — Other Ambulatory Visit (HOSPITAL_COMMUNITY): Payer: Self-pay | Admitting: General Surgery

## 2015-07-08 DIAGNOSIS — R109 Unspecified abdominal pain: Secondary | ICD-10-CM

## 2015-07-13 ENCOUNTER — Ambulatory Visit (HOSPITAL_COMMUNITY)
Admission: RE | Admit: 2015-07-13 | Discharge: 2015-07-13 | Disposition: A | Discharge status: Home / Self Care | Payer: BLUE CROSS/BLUE SHIELD | Source: Ambulatory Visit | Attending: General Surgery | Admitting: General Surgery

## 2015-07-13 DIAGNOSIS — R1011 Right upper quadrant pain: Secondary | ICD-10-CM | POA: Insufficient documentation

## 2015-07-13 DIAGNOSIS — R109 Unspecified abdominal pain: Secondary | ICD-10-CM

## 2015-07-13 MED ORDER — TECHNETIUM TC 99M MEBROFENIN IV KIT: 5.0000 | PACK | Freq: Once | INTRAVENOUS | Status: DC | PRN

## 2015-07-14 ENCOUNTER — Encounter: Payer: Self-pay | Admitting: Physician Assistant

## 2015-07-22 ENCOUNTER — Ambulatory Visit (INDEPENDENT_AMBULATORY_CARE_PROVIDER_SITE_OTHER): Payer: BLUE CROSS/BLUE SHIELD | Admitting: Physician Assistant

## 2015-07-22 ENCOUNTER — Encounter: Payer: Self-pay | Admitting: Physician Assistant

## 2015-07-22 VITALS — BP 110/72 | HR 76 | Ht 67.5 in | Wt 179.0 lb

## 2015-07-22 DIAGNOSIS — R1011 Right upper quadrant pain: Secondary | ICD-10-CM | POA: Diagnosis not present

## 2015-07-22 DIAGNOSIS — R1013 Epigastric pain: Secondary | ICD-10-CM

## 2015-07-22 MED ORDER — PANTOPRAZOLE SODIUM 40 MG PO TBEC: DELAYED_RELEASE_TABLET | ORAL | Status: DC

## 2015-07-22 NOTE — Progress Notes (Signed)
Agree with assessment and plan as outlined.  

## 2015-07-22 NOTE — Progress Notes (Signed)
Patient ID: Mitchell Adams, male   DOB: [birth year], 55 y.o.   MRN: [id number]   Subjective:    Patient ID: Mitchell Adams, male    DOB: 1960/12/18, 55 y.o.   MRN: [id number]  HPI  Mitchell Adams is a 55 year old African-American male, new to GI today referred by Dr. Serita Grammes for evaluation of persistent right upper quadrant pain. Patient has prior GI history in [place]. He relates having a colon perforation in 2014 as a result of colonoscopy with polypectomy Patient says he had 6 polyps removed. He did require surgery but is not sure if he had any bowel resected. He remembers being told to follow-up in 5 years as far as polyps were concerned. Patient had been seen by Dr. Donne Hazel here for ongoing upper abdominal pain and was found to have a ventral hernia. He underwent exploratory lap with lysis of adhesions and repair of an incisional hernia with mesh in January 2017. His was for a 7 cm supraumbilical hernia and a second smaller ventral hernia. Exline Unfortunately has continued to have his same pain postoperatively. He underwent upper abdominal ultrasound March 2017 which was negative and a CCK HIDA scan which was normal. CT of the abdomen and pelvis watery gastric distention and staples along the hepatic flexure but otherwise was negative. Patient says he has had this pain for several months which is fairly severe at times sates really not any better after surgery. He says he has pain after eating on a daily basis usually within 10-15 minutes of eating and sometimes has onset while he still eating. He describes it as a burning and sharp type of pain sometimes associated with nausea occasionally has had episodes of vomiting.Mitchell Adams He also says pain will be aggravated by prolonged sitting. He has not noted any changes with his bowel habits no melena or hematochezia. He has lost about 6 pounds over the past couple of months he thinks because he is eating less due to the pain. He is very uncomfortable with heavy  food. He denies any regular aspirin or NSAIDs.  Review of Systems Pertinent positive and negative review of systems were noted in the above HPI section.  All other review of systems was otherwise negative.  Outpatient Encounter Prescriptions as of 07/22/2015  Medication Sig  . Metaxalone (SKELAXIN PO) Take 1 tablet by mouth.  . traMADol (ULTRAM) 50 MG tablet Take 50 mg by mouth every 6 (six) hours as needed for moderate pain.  Mitchell Adams alum hydroxide-mag trisilicate (GAVISCON) [id number] MG CHEW chewable tablet Chew 2 tablets by mouth. Reported on 07/22/2015  . pantoprazole (PROTONIX) 40 MG tablet Take 1 tab every morning.  . [DISCONTINUED] docusate sodium (COLACE) 100 MG capsule Take 1 capsule (100 mg total) by mouth 2 (two) times daily.  . [DISCONTINUED] ibuprofen (ADVIL,MOTRIN) 400 MG tablet Take 400 mg by mouth every 6 (six) hours as needed for headache.  . [DISCONTINUED] oxyCODONE (OXY IR/ROXICODONE) 5 MG immediate release tablet Take 1-2 tablets (5-10 mg total) by mouth every 6 (six) hours as needed for moderate pain, severe pain or breakthrough pain.   No facility-administered encounter medications on file as of 07/22/2015.   Allergies  Allergen Reactions  . Penicillins Hives and Other (See Comments)    Has patient had a PCN reaction causing immediate rash, facial/tongue/throat swelling, SOB or lightheadedness with hypotension: Yes Has patient had a PCN reaction causing severe rash involving mucus membranes or skin necrosis: No Has patient had a PCN reaction that required hospitalization No Pt. Reports  increased heart rate  & faint feeling  Has patient had a PCN reaction occurring within the last 10 years: No If all of the above answers are "NO", then may proceed with Cephalosporin use.    Patient Active Problem List   Diagnosis Date Noted  . Incisional hernia 04/29/2015   Social History   Social History  . Marital Status: Significant Other    Spouse Name: N/A  . Number of Children: N/A    . Years of Education: N/A   Occupational History  . Not on file.   Social History Main Topics  . Smoking status: Former Smoker -- 0.05 packs/day for 43 years    Types: Cigarettes    Quit date: 06/22/2014  . Smokeless tobacco: Never Used  . Alcohol Use: Yes     Comment: 04/29/2015 "stopped drinking 11/2014"  . Drug Use: Yes    Special: Marijuana     Comment: 04/29/2015 "smoked some weed in the 1980s  . Sexual Activity: Yes   Other Topics Concern  . Not on file   Social History Narrative    Mr. Meader family history includes Diabetes in his maternal grandfather and mother; Heart disease in his sister.      Objective:    Filed Vitals:   07/22/15 0837  BP: 110/72  Pulse: 76    Physical Exam  well-developed African American male in no acute distress, pleasant blood pressure 110/72 pulse 76 height 5 foot 7 weight 179. HEENT; nontraumatic, cephalic EOMI PERRLA sclera anicteric, Cardiovascular; regular rate and rhythm with S1-S2 no murmur or gallop, Pulmonary; clear bilaterally, Abdomen; soft, bowel sounds are present is a large midline incisional scar and small right upper quadrant incisional scar he is tender in the right upper quadrant there is no guarding or rebound no palpable mass or hepatosplenomegaly no palpable hernia defect, Rectal; exam not done, Ext; no clubbing cyanosis or edema skin warm and dry, Neuropsych; mood and affect appropriate     Assessment & Plan:   #1 55 yo male With several month history of persistent right upper quadrant pain. Negative workup thus far with CT of the abdomen and pelvis, upper abdominal ultrasound and CCK HIDA scan. Pain persisting after exploratory lap/lysis of adhesions and repair of incisional hernia with mesh which was done for this same pain Etiology of pain is not clear, may still be adhesion related, rule out peptic ulcer disease, gastroparesis. #2 history of colon perforation status post resection 2014 related to colonoscopy and  polypectomy done in [place] #3 probable adenomatous colon polyps patient was told to follow-up in 5 years do not have path report available   Plan;  Start Protonix 40 mg by mouth every morning Oh aspirin or NSAIDs Will schedule for EGD with Dr. Havery Moros. Procedure discussed in detail with patient and he is agreeable to proceed If EGD is unrevealing consider upper GI small bowel follow-through and gastric emptying scan.    Amy S Esterwood PA-C 07/22/2015   Cc: Rolm Bookbinder, MD

## 2015-07-22 NOTE — Patient Instructions (Addendum)
You have been scheduled for a endoscopy. Please follow written instructions given to you at your visit today.  Please pick up your prep supplies at the pharmacy within the next 1-3 days. If you use inhalers (even only as needed), please bring them with you on the day of your procedure. Your physician has requested that you go to www.startemmi.com and enter the access code given to you at your visit today. This web site gives a general overview about your procedure. However, you should still follow specific instructions given to you by our office regarding your preparation for the procedure.         Normal BMI (Body Mass Index- based on height and weight) is between 19 and 25. Your BMI today is Body mass index is 27.61 kg/(m^2). Marland Kitchen Please consider follow up  regarding your BMI with your Primary Care Provider.

## 2015-07-23 ENCOUNTER — Ambulatory Visit (AMBULATORY_SURGERY_CENTER): Payer: BLUE CROSS/BLUE SHIELD | Admitting: Gastroenterology

## 2015-07-23 ENCOUNTER — Encounter: Payer: Self-pay | Admitting: Gastroenterology

## 2015-07-23 VITALS — BP 107/67 | HR 74 | Temp 98.6°F | Resp 13 | Ht 67.0 in | Wt 179.0 lb

## 2015-07-23 DIAGNOSIS — K2990 Gastroduodenitis, unspecified, without bleeding: Secondary | ICD-10-CM

## 2015-07-23 DIAGNOSIS — R1013 Epigastric pain: Secondary | ICD-10-CM

## 2015-07-23 DIAGNOSIS — K2970 Gastritis, unspecified, without bleeding: Secondary | ICD-10-CM

## 2015-07-23 MED ORDER — SODIUM CHLORIDE 0.9 % IV SOLN: 500.0000 mL | INTRAVENOUS | Status: DC

## 2015-07-23 NOTE — Progress Notes (Signed)
Called to room to assist during endoscopic procedure.  Patient ID and intended procedure confirmed with present staff. Received instructions for my participation in the procedure from the performing physician.  

## 2015-07-23 NOTE — Patient Instructions (Signed)
FOLLOW DISCHARGE INSTRUCTIONS (BLUE AND GREEN SHEETS).YOU HAD AN ENDOSCOPIC PROCEDURE TODAY AT [facility]:   Refer to the procedure report that was given to you for any specific questions about what was found during the examination.  If the procedure report does not answer your questions, please call your gastroenterologist to clarify.  If you requested that your care partner not be given the details of your procedure findings, then the procedure report has been included in a sealed envelope for you to review at your convenience later.  YOU SHOULD EXPECT: Some feelings of bloating in the abdomen. Passage of more gas than usual.  Walking can help get rid of the air that was put into your GI tract during the procedure and reduce the bloating. If you had a lower endoscopy (such as a colonoscopy or flexible sigmoidoscopy) you may notice spotting of blood in your stool or on the toilet paper. If you underwent a bowel prep for your procedure, you may not have a normal bowel movement for a few days.  Please Note:  You might notice some irritation and congestion in your nose or some drainage.  This is from the oxygen used during your procedure.  There is no need for concern and it should clear up in a day or so.  SYMPTOMS TO REPORT IMMEDIATELY:    Following upper endoscopy (EGD)  Vomiting of blood or coffee ground material  New chest pain or pain under the shoulder blades  Painful or persistently difficult swallowing  New shortness of breath  Fever of 100F or higher  Black, tarry-looking stools  For urgent or emergent issues, a gastroenterologist can be reached at any hour by calling [phone].   DIET: Your first meal following the procedure should be a small meal and then it is ok to progress to your normal diet. Heavy or fried foods are harder to digest and may make you feel nauseous or bloated.  Likewise, meals heavy in dairy and vegetables can increase bloating.  Drink plenty  of fluids but you should avoid alcoholic beverages for 24 hours.  ACTIVITY:  You should plan to take it easy for the rest of today and you should NOT DRIVE or use heavy machinery until tomorrow (because of the sedation medicines used during the test).    FOLLOW UP: Our staff will call the number listed on your records the next business day following your procedure to check on you and address any questions or concerns that you may have regarding the information given to you following your procedure. If we do not reach you, we will leave a message.  However, if you are feeling well and you are not experiencing any problems, there is no need to return our call.  We will assume that you have returned to your regular daily activities without incident.  If any biopsies were taken you will be contacted by phone or by letter within the next 1-3 weeks.  Please call us at [phone] if you have not heard about the biopsies in 3 weeks.    SIGNATURES/CONFIDENTIALITY: You and/or your care partner have signed paperwork which will be entered into your electronic medical record.  These signatures attest to the fact that that the information above on your After Visit Summary has been reviewed and is understood.  Full responsibility of the confidentiality of this discharge information lies with you and/or your care-partner.  Await pathology reports.

## 2015-07-23 NOTE — Op Note (Signed)
[facility] Patient Name: Mitchell Adams Procedure Date: 07/23/2015 4:00 PM MRN: [id number] Endoscopist: Remo Lipps P. Havery Moros , MD Age: 55 Referring MD:  Date of Birth: 04-Oct-1960 Gender: Male Procedure:                Upper GI endoscopy Indications:              Abdominal pain in the right upper quadrant Medicines:                Monitored Anesthesia Care Procedure:                Pre-Anesthesia Assessment:                           - Prior to the procedure, a History and Physical                            was performed, and patient medications and                            allergies were reviewed. The patient's tolerance of                            previous anesthesia was also reviewed. The risks                            and benefits of the procedure and the sedation                            options and risks were discussed with the patient.                            All questions were answered, and informed consent                            was obtained. Prior Anticoagulants: The patient has                            taken no previous anticoagulant or antiplatelet                            agents. ASA Grade Assessment: II - A patient with                            mild systemic disease. After reviewing the risks                            and benefits, the patient was deemed in                            satisfactory condition to undergo the procedure.                           After obtaining informed consent, the endoscope was  passed under direct vision. Throughout the                            procedure, the patient's blood pressure, pulse, and                            oxygen saturations were monitored continuously. The                            Model GIF-HQ190 [id number]) scope was introduced                            through the mouth, and advanced to the second part                            of duodenum. The upper GI  endoscopy was                            accomplished without difficulty. The patient                            tolerated the procedure well. Scope In: Scope Out: Findings:      Esophagogastric landmarks were identified: the Z-line was found at 38       cm, the gastroesophageal junction was found at 38 cm and the upper       extent of the gastric folds was found at 38 cm from the incisors.      LA Grade A (one or more mucosal breaks less than 5 mm, not extending       between tops of 2 mucosal folds) esophagitis was found 38 cm from the       incisors.      The exam of the esophagus was otherwise normal.      The entire examined stomach was normal. Biopsies were taken with a cold       forceps for Helicobacter pylori testing.      The duodenal bulb and second portion of the duodenum were normal.       Biopsies for histology were taken with a cold forceps for evaluation of       celiac disease. Complications:            No immediate complications. Estimated blood loss:                            Minimal. Estimated Blood Loss:     Estimated blood loss was minimal. Impression:               - Esophagogastric landmarks identified.                           - LA Grade A esophagitis.                           - Normal stomach. Biopsied.                           - Normal duodenal bulb and second portion of the  duodenum. Biopsied. Recommendation:           - Patient has a contact number available for                            emergencies. The signs and symptoms of potential                            delayed complications were discussed with the                            patient. Return to normal activities tomorrow.                            Written discharge instructions were provided to the                            patient.                           - Resume previous diet.                           - Continue present medications.                            - Increase protonix to twice daily for 2 weeks                           - Await pathology results.                           - No repeat upper endoscopy. Procedure Code(s):        --- Professional ---                           [phone], Esophagogastroduodenoscopy, flexible,                            transoral; with biopsy, single or multiple CPT copyright 2016 [employer]. All rights reserved. Remo Lipps P. Shraga Custard, MD 07/23/2015 4:35:22 PM This report has been signed electronically. Number of Addenda: 0 Referring MD:      Dewayne Shorter

## 2015-07-23 NOTE — Progress Notes (Signed)
Patient awakening,vss,report to rn 

## 2015-07-26 ENCOUNTER — Telehealth: Payer: Self-pay | Admitting: *Deleted

## 2015-07-26 NOTE — Telephone Encounter (Signed)
  Follow up Call-  Call back number 07/23/2015  Post procedure Call Back phone  # [phone]  Permission to leave phone message Yes     No answer,left message.

## 2015-08-07 ENCOUNTER — Encounter: Payer: Self-pay | Admitting: Gastroenterology

## 2015-08-24 ENCOUNTER — Telehealth: Payer: Self-pay | Admitting: Gastroenterology

## 2015-08-24 NOTE — Telephone Encounter (Signed)
Spoke with Mitchell Adams at [employer]. Patient saw Dr. Donne Hazel yesterday and c/o abdominal pain, food getting stuck and nausea.  Mitchell Adams states patient did not report he was taking any PPI.(Per EGD report on 07/23/15, patient was to take Protonix) Dr. Donne Hazel started him on Prilosec 20 mg po daily. Please, advise if needs OV or additional treatment.

## 2015-08-24 NOTE — Telephone Encounter (Signed)
I reviewed his records. I did not see him in the office but did see him for EGD. He had mild esophagitis, and Amy had given him protonix 40mg . I thought he was on it once daily at the time of the procedure but I guess he did not start it. It doesn't matter if omeprazole or protonix, he should be on this twice per day for 2 weeks, and then once daily. He had no stenosis or evidence of pathology on EGD to account for his dysphagia. I have not seen this patient in clinic yet. Can you please book him for a follow up with me so I can reassess him after he had started PPI. Thanks

## 2015-08-25 MED ORDER — PANTOPRAZOLE SODIUM 40 MG PO TBEC: DELAYED_RELEASE_TABLET | ORAL | Status: DC

## 2015-08-25 NOTE — Telephone Encounter (Signed)
Left a message for patient to call back. 

## 2015-08-25 NOTE — Telephone Encounter (Signed)
Spoke with patient and gave him recommendations. Rx sent to pharmacy. OV scheduled on 09/30/15 at 3:00 PM.

## 2015-09-13 ENCOUNTER — Emergency Department (HOSPITAL_COMMUNITY)
Admission: EM | Admit: 2015-09-13 | Discharge: 2015-09-13 | Disposition: A | Discharge status: Home / Self Care | Payer: BLUE CROSS/BLUE SHIELD | Attending: Emergency Medicine | Admitting: Emergency Medicine

## 2015-09-13 ENCOUNTER — Emergency Department (HOSPITAL_COMMUNITY): Payer: BLUE CROSS/BLUE SHIELD

## 2015-09-13 ENCOUNTER — Encounter (HOSPITAL_COMMUNITY): Payer: Self-pay | Admitting: Emergency Medicine

## 2015-09-13 DIAGNOSIS — R0789 Other chest pain: Secondary | ICD-10-CM | POA: Insufficient documentation

## 2015-09-13 DIAGNOSIS — Z880 Allergy status to penicillin: Secondary | ICD-10-CM | POA: Diagnosis not present

## 2015-09-13 DIAGNOSIS — R0602 Shortness of breath: Secondary | ICD-10-CM | POA: Diagnosis not present

## 2015-09-13 DIAGNOSIS — R05 Cough: Secondary | ICD-10-CM | POA: Insufficient documentation

## 2015-09-13 DIAGNOSIS — K219 Gastro-esophageal reflux disease without esophagitis: Secondary | ICD-10-CM | POA: Insufficient documentation

## 2015-09-13 DIAGNOSIS — Z79899 Other long term (current) drug therapy: Secondary | ICD-10-CM | POA: Insufficient documentation

## 2015-09-13 DIAGNOSIS — R059 Cough, unspecified: Secondary | ICD-10-CM

## 2015-09-13 DIAGNOSIS — R079 Chest pain, unspecified: Secondary | ICD-10-CM | POA: Diagnosis present

## 2015-09-13 LAB — CBC
HEMATOCRIT: 42.6 % (ref 39.0–52.0)
HEMOGLOBIN: 14.1 g/dL (ref 13.0–17.0)
MCH: 31 pg (ref 26.0–34.0)
MCHC: 33.1 g/dL (ref 30.0–36.0)
MCV: 93.6 fL (ref 78.0–100.0)
Platelets: 238 10*3/uL (ref 150–400)
RBC: 4.55 MIL/uL (ref 4.22–5.81)
RDW: 12 % (ref 11.5–15.5)
WBC: 6.6 10*3/uL (ref 4.0–10.5)

## 2015-09-13 LAB — I-STAT TROPONIN, ED
TROPONIN I, POC: 0 ng/mL (ref 0.00–0.08)
Troponin i, poc: 0 ng/mL (ref 0.00–0.08)

## 2015-09-13 LAB — BASIC METABOLIC PANEL
ANION GAP: 8 (ref 5–15)
BUN: 15 mg/dL (ref 6–20)
CHLORIDE: 107 mmol/L (ref 101–111)
CO2: 23 mmol/L (ref 22–32)
Calcium: 9.1 mg/dL (ref 8.9–10.3)
Creatinine, Ser: 0.86 mg/dL (ref 0.61–1.24)
GFR calc non Af Amer: >60 mL/min (ref >60)
GLUCOSE: 107 mg/dL — AB (ref 65–99)
POTASSIUM: 4.2 mmol/L (ref 3.5–5.1)
SODIUM: 138 mmol/L (ref 135–145)

## 2015-09-13 MED ORDER — IPRATROPIUM-ALBUTEROL 0.5-2.5 (3) MG/3ML IN SOLN
3.0000 mL | Freq: Once | RESPIRATORY_TRACT | Status: AC
  Administered 2015-09-13: 3 mL via RESPIRATORY_TRACT
  Filled 2015-09-13: qty 3

## 2015-09-13 MED ORDER — ALBUTEROL SULFATE HFA 108 (90 BASE) MCG/ACT IN AERS: 2.0000 | INHALATION_SPRAY | RESPIRATORY_TRACT | Status: DC | PRN

## 2015-09-13 MED ORDER — PREDNISONE 20 MG PO TABS: 40.0000 mg | ORAL_TABLET | Freq: Every day | ORAL | Status: DC

## 2015-09-13 NOTE — ED Notes (Signed)
C/o L sided chest pressure with nausea since yesterday.  Reports sob and productive cough with white sputum since inhaling spray from FIT test on Tuesday.

## 2015-09-13 NOTE — Discharge Instructions (Signed)
Nonspecific Chest Pain  °Chest pain can be caused by many different conditions. There is always a chance that your pain could be related to something serious, such as a heart attack or a blood clot in your lungs. Chest pain can also be caused by conditions that are not life-threatening. If you have chest pain, it is very important to follow up with your health care provider. °CAUSES  °Chest pain can be caused by: °· Heartburn. °· Pneumonia or bronchitis. °· Anxiety or stress. °· Inflammation around your heart (pericarditis) or lung (pleuritis or pleurisy). °· A blood clot in your lung. °· A collapsed lung (pneumothorax). It can develop suddenly on its own (spontaneous pneumothorax) or from trauma to the chest. °· Shingles infection (varicella-zoster virus). °· Heart attack. °· Damage to the bones, muscles, and cartilage that make up your chest wall. This can include: °¨ Bruised bones due to injury. °¨ Strained muscles or cartilage due to frequent or repeated coughing or overwork. °¨ Fracture to one or more ribs. °¨ Sore cartilage due to inflammation (costochondritis). °RISK FACTORS  °Risk factors for chest pain may include: °· Activities that increase your risk for trauma or injury to your chest. °· Respiratory infections or conditions that cause frequent coughing. °· Medical conditions or overeating that can cause heartburn. °· Heart disease or family history of heart disease. °· Conditions or health behaviors that increase your risk of developing a blood clot. °· Having had chicken pox (varicella zoster). °SIGNS AND SYMPTOMS °Chest pain can feel like: °· Burning or tingling on the surface of your chest or deep in your chest. °· Crushing, pressure, aching, or squeezing pain. °· Dull or sharp pain that is worse when you move, cough, or take a deep breath. °· Pain that is also felt in your back, neck, shoulder, or arm, or pain that spreads to any of these areas. °Your chest pain may come and go, or it may stay  constant. °DIAGNOSIS °Lab tests or other studies may be needed to find the cause of your pain. Your health care provider may have you take a test called an ambulatory ECG (electrocardiogram). An ECG records your heartbeat patterns at the time the test is performed. You may also have other tests, such as: °· Transthoracic echocardiogram (TTE). During echocardiography, sound waves are used to create a picture of all of the heart structures and to look at how blood flows through your heart. °· Transesophageal echocardiogram (TEE). This is a more advanced imaging test that obtains images from inside your body. It allows your health care provider to see your heart in finer detail. °· Cardiac monitoring. This allows your health care provider to monitor your heart rate and rhythm in real time. °· Holter monitor. This is a portable device that records your heartbeat and can help to diagnose abnormal heartbeats. It allows your health care provider to track your heart activity for several days, if needed. °· Stress tests. These can be done through exercise or by taking medicine that makes your heart beat more quickly. °· Blood tests. °· Imaging tests. °TREATMENT  °Your treatment depends on what is causing your chest pain. Treatment may include: °· Medicines. These may include: °¨ Acid blockers for heartburn. °¨ Anti-inflammatory medicine. °¨ Pain medicine for inflammatory conditions. °¨ Antibiotic medicine, if an infection is present. °¨ Medicines to dissolve blood clots. °¨ Medicines to treat coronary artery disease. °· Supportive care for conditions that do not require medicines. This may include: °¨ Resting. °¨ Applying heat   or cold packs to injured areas.  Limiting activities until pain decreases. HOME CARE INSTRUCTIONS  If you were prescribed an antibiotic medicine, finish it all even if you start to feel better.  Avoid any activities that bring on chest pain.  Do not use any tobacco products, including  cigarettes, chewing tobacco, or electronic cigarettes. If you need help quitting, ask your health care provider.  Do not drink alcohol.  Take medicines only as directed by your health care provider.  Keep all follow-up visits as directed by your health care provider. This is important. This includes any further testing if your chest pain does not go away.  If heartburn is the cause for your chest pain, you may be told to keep your head raised (elevated) while sleeping. This reduces the chance that acid will go from your stomach into your esophagus.  Make lifestyle changes as directed by your health care provider. These may include:  Getting regular exercise. Ask your health care provider to suggest some activities that are safe for you.  Eating a heart-healthy diet. A registered dietitian can help you to learn healthy eating options.  Maintaining a healthy weight.  Managing diabetes, if necessary.  Reducing stress. SEEK MEDICAL CARE IF:  Your chest pain does not go away after treatment.  You have a rash with blisters on your chest.  You have a fever. SEEK IMMEDIATE MEDICAL CARE IF:   Your chest pain is worse.  You have an increasing cough, or you cough up blood.  You have severe abdominal pain.  You have severe weakness.  You faint.  You have chills.  You have sudden, unexplained chest discomfort.  You have sudden, unexplained discomfort in your arms, back, neck, or jaw.  You have shortness of breath at any time.  You suddenly start to sweat, or your skin gets clammy.  You feel nauseous or you vomit.  You suddenly feel light-headed or dizzy.  Your heart begins to beat quickly, or it feels like it is skipping beats. These symptoms may represent a serious problem that is an emergency. Do not wait to see if the symptoms will go away. Get medical help right away. Call your local emergency services (911 in the [place] Do not drive yourself to the hospital.   This  information is not intended to replace advice given to you by your health care provider. Make sure you discuss any questions you have with your health care provider.   Document Released: 01/18/2005 Document Revised: 05/01/2014 Document Reviewed: 11/14/2013 Elsevier Interactive Patient Education 2016 Else[employer] Cough, Adult Coughing is a reflex that clears your throat and your airways. Coughing helps to heal and protect your lungs. It is normal to cough occasionally, but a cough that happens with other symptoms or lasts a long time may be a sign of a condition that needs treatment. A cough may last only 2-3 weeks (acute), or it may last longer than 8 weeks (chronic). CAUSES Coughing is commonly caused by:  Breathing in substances that irritate your lungs.  A viral or bacterial respiratory infection.  Allergies.  Asthma.  Postnasal drip.  Smoking.  Acid backing up from the stomach into the esophagus (gastroesophageal reflux).  Certain medicines.  Chronic lung problems, including COPD (or rarely, lung cancer).  Other medical conditions such as heart failure. HOME CARE INSTRUCTIONS  Pay attention to any changes in your symptoms. Take these actions to help with your discomfort:  Take medicines only as told by your health care  provider.  If you were prescribed an antibiotic medicine, take it as told by your health care provider. Do not stop taking the antibiotic even if you start to feel better.  Talk with your health care provider before you take a cough suppressant medicine.  Drink enough fluid to keep your urine clear or pale yellow.  If the air is dry, use a cold steam vaporizer or humidifier in your bedroom or your home to help loosen secretions.  Avoid anything that causes you to cough at work or at home.  If your cough is worse at night, try sleeping in a semi-upright position.  Avoid cigarette smoke. If you smoke, quit smoking. If you need help quitting, ask your  health care provider.  Avoid caffeine.  Avoid alcohol.  Rest as needed. SEEK MEDICAL CARE IF:   You have new symptoms.  You cough up pus.  Your cough does not get better after 2-3 weeks, or your cough gets worse.  You cannot control your cough with suppressant medicines and you are losing sleep.  You develop pain that is getting worse or pain that is not controlled with pain medicines.  You have a fever.  You have unexplained weight loss.  You have night sweats. SEEK IMMEDIATE MEDICAL CARE IF:  You cough up blood.  You have difficulty breathing.  Your heartbeat is very fast.   This information is not intended to replace advice given to you by your health care provider. Make sure you discuss any questions you have with your health care provider.   Document Released: 10/07/2010 Document Revised: 12/30/2014 Document Reviewed: 06/17/2014 Elsevier Interactive Patient Education [employer].

## 2015-09-13 NOTE — ED Provider Notes (Signed)
CSN: [id number]     Arrival date & time 09/13/15  0446 History   First MD Initiated Contact with Patient 09/13/15 [phone]     Chief Complaint  Patient presents with  . Chest Pain     (Consider location/radiation/quality/duration/timing/severity/associated sxs/prior Treatment) HPI Comments: Patient presents to the emergency department with chief complaint of left-sided chest pressure. He reports associated shortness of breath and productive cough. He states that he has had the symptoms since last week. He states that they've not been improving. He denies any radiating pain. Denies any modifying factors. He states that when he woke this morning and his symptoms were worse, he decided to come be evaluated. He denies any prior heart or lung problems. He states that he wears a respirator at work, and states that he did not feel that he could go to work today and wear the respirator with his symptoms.  The history is provided by the patient. No language interpreter was used.    Past Medical History  Diagnosis Date  . Hernia, umbilical   . GERD (gastroesophageal reflux disease)     diagnosed with - +yes- GERD, occas. use of chewable antacid    Past Surgical History  Procedure Laterality Date  . Colon surgery    . Abdominal surgery  2014    due to puncture of bowel after removal of polyps   . Hernia repair    . Incisional hernia repair  04/29/2015  . Abdominal exploration surgery  04/29/2015    w/LOA  . Colonoscopy w/ polypectomy  2014  . Abdominal exploration surgery  2014    S/P colonoscopy w/multiple polyps removed/notes 04/29/2015  . Appendectomy  ~ 1987  . Incisional hernia repair N/A 04/29/2015    Procedure: OPEN INCISIONAL HERNIA REPAIR WITH MESH WITH MYOFACIAL RELEASE;  Surgeon: Rolm Bookbinder, MD;  Location: [facility] OR;  Service: General;  Laterality: N/A;   Family History  Problem Relation Age of Onset  . Diabetes Mother   . Heart disease Sister   . Diabetes Maternal Grandfather     Social History  Substance Use Topics  . Smoking status: Former Smoker -- 0.05 packs/day for 43 years    Types: Cigarettes    Quit date: 06/22/2014  . Smokeless tobacco: Never Used  . Alcohol Use: Yes     Comment: 04/29/2015 "stopped drinking 11/2014"    Review of Systems  Constitutional: Negative for fever and chills.  Respiratory: Positive for cough, chest tightness and shortness of breath.   Cardiovascular: Negative for chest pain.  Gastrointestinal: Negative for nausea, vomiting, diarrhea and constipation.  Genitourinary: Negative for dysuria.  All other systems reviewed and are negative.     Allergies  Penicillins  Home Medications   Prior to Admission medications   Medication Sig Start Date End Date Taking? Authorizing Provider  alum hydroxide-mag trisilicate (GAVISCON) [id number] MG CHEW chewable tablet Chew 2 tablets by mouth. Reported on 07/23/2015    Historical Provider, MD  Metaxalone (SKELAXIN PO) Take 1 tablet by mouth.    Historical Provider, MD  pantoprazole (PROTONIX) 40 MG tablet Take one po BID x 2 weeks then take one po daily 08/25/15   Manus Gunning, MD  traMADol (ULTRAM) 50 MG tablet Take 50 mg by mouth every 6 (six) hours as needed for moderate pain.    Historical Provider, MD   BP 122/84 mmHg  Pulse 55  Resp 16  SpO2 96% Physical Exam  Constitutional: He is oriented to person, place, and time. He  appears well-developed and well-nourished.  HENT:  Head: Normocephalic and atraumatic.  Eyes: Conjunctivae and EOM are normal. Pupils are equal, round, and reactive to light. Right eye exhibits no discharge. Left eye exhibits no discharge. No scleral icterus.  Neck: Normal range of motion. Neck supple. No JVD present.  Cardiovascular: Normal rate, regular rhythm and normal heart sounds.  Exam reveals no gallop and no friction rub.   No murmur heard. Pulmonary/Chest: Effort normal and breath sounds normal. No respiratory distress. He has no wheezes. He has  no rales. He exhibits no tenderness.  Abdominal: Soft. He exhibits no distension and no mass. There is no tenderness. There is no rebound and no guarding.  Musculoskeletal: Normal range of motion. He exhibits no edema or tenderness.  Neurological: He is alert and oriented to person, place, and time.  Skin: Skin is warm and dry.  Psychiatric: He has a normal mood and affect. His behavior is normal. Judgment and thought content normal.  Nursing note and vitals reviewed.   ED Course  Procedures (including critical care time) Labs Review Labs Reviewed  BASIC METABOLIC PANEL - Abnormal; Notable for the following:    Glucose, Bld 107 (*)    All other components within normal limits  CBC  I-STAT TROPOININ, ED  Randolm Idol, ED    Imaging Review Dg Chest 2 View  09/13/2015  CLINICAL DATA:  Chest pain and tightness for days. Cough for 1 week. EXAM: CHEST  2 VIEW COMPARISON:  04/29/2014 FINDINGS: The cardiomediastinal contours are normal. Trace subsegmental atelectasis at the left lung base. Pulmonary vasculature is normal. No consolidation, pleural effusion, or pneumothorax. No acute osseous abnormalities are seen. IMPRESSION: No acute pulmonary process. Electronically Signed   By: Jeb Levering M.D.   On: 09/13/2015 06:11   I have personally reviewed and evaluated these images and lab results as part of my medical decision-making.   EKG Interpretation   Date/Time:  Monday Sep 13 2015 04:53:44 EDT Ventricular Rate:  79 PR Interval:  142 QRS Duration: 90 QT Interval:  360 QTC Calculation: 412 R Axis:   68 Text Interpretation:  Normal sinus rhythm with sinus arrhythmia No  significant change since last tracing Artifact Borderline ECG Confirmed by  Carmin Muskrat  MD ([id number]) on 09/13/2015 8:09:41 AM      MDM   Final diagnoses:  Cough  Chest tightness    Patient with cough, chest tightness, and shortness of breath symptoms for the past week or so. States symptoms worsened  this morning when he awoke, so he came for evaluation. He denies any other medical problems. He is low risk for ACS. Heart score is 1.  Low risk for PE per Well's PE criteria.  CXR is clear.  Delta troponin is negative.  Will give inhaler and some prednisone.  Possible bronchitis.    Montine Circle, PA-C 09/13/15 [id number]  Carmin Muskrat, MD 09/13/15 [phone]

## 2015-09-30 ENCOUNTER — Ambulatory Visit: Payer: BLUE CROSS/BLUE SHIELD | Admitting: Gastroenterology

## 2015-10-07 ENCOUNTER — Telehealth: Payer: Self-pay | Admitting: *Deleted

## 2015-10-07 NOTE — Telephone Encounter (Signed)
I sent a second request fax requesting records from [facility].  Received fax back today 10-07-2015 that they do not have these records on this patient. Supposed to be a colon and pathology from 2014.

## 2015-11-15 ENCOUNTER — Encounter (HOSPITAL_COMMUNITY): Payer: Self-pay | Admitting: *Deleted

## 2015-11-15 ENCOUNTER — Emergency Department (HOSPITAL_COMMUNITY)
Admission: EM | Admit: 2015-11-15 | Discharge: 2015-11-15 | Disposition: A | Discharge status: Home / Self Care | Payer: BLUE CROSS/BLUE SHIELD | Attending: Emergency Medicine | Admitting: Emergency Medicine

## 2015-11-15 ENCOUNTER — Emergency Department (HOSPITAL_COMMUNITY): Payer: BLUE CROSS/BLUE SHIELD

## 2015-11-15 DIAGNOSIS — R0789 Other chest pain: Secondary | ICD-10-CM | POA: Insufficient documentation

## 2015-11-15 DIAGNOSIS — Z87891 Personal history of nicotine dependence: Secondary | ICD-10-CM | POA: Insufficient documentation

## 2015-11-15 DIAGNOSIS — R05 Cough: Secondary | ICD-10-CM | POA: Diagnosis not present

## 2015-11-15 DIAGNOSIS — Z79899 Other long term (current) drug therapy: Secondary | ICD-10-CM | POA: Insufficient documentation

## 2015-11-15 DIAGNOSIS — R059 Cough, unspecified: Secondary | ICD-10-CM

## 2015-11-15 LAB — BASIC METABOLIC PANEL
Anion gap: 8 (ref 5–15)
BUN: 16 mg/dL (ref 6–20)
CALCIUM: 9.1 mg/dL (ref 8.9–10.3)
CO2: 23 mmol/L (ref 22–32)
CREATININE: 0.89 mg/dL (ref 0.61–1.24)
Chloride: 109 mmol/L (ref 101–111)
GFR calc non Af Amer: >60 mL/min (ref >60)
Glucose, Bld: 108 mg/dL — ABNORMAL HIGH (ref 65–99)
Potassium: 3.6 mmol/L (ref 3.5–5.1)
SODIUM: 140 mmol/L (ref 135–145)

## 2015-11-15 LAB — CBC
HCT: 41.3 % (ref 39.0–52.0)
Hemoglobin: 13.8 g/dL (ref 13.0–17.0)
MCH: 32 pg (ref 26.0–34.0)
MCHC: 33.4 g/dL (ref 30.0–36.0)
MCV: 95.8 fL (ref 78.0–100.0)
PLATELETS: 235 10*3/uL (ref 150–400)
RBC: 4.31 MIL/uL (ref 4.22–5.81)
RDW: 11.8 % (ref 11.5–15.5)
WBC: 7 10*3/uL (ref 4.0–10.5)

## 2015-11-15 LAB — I-STAT TROPONIN, ED: TROPONIN I, POC: 0 ng/mL (ref 0.00–0.08)

## 2015-11-15 MED ORDER — GUAIFENESIN-CODEINE 100-10 MG/5ML PO SOLN: 10.0000 mL | Freq: Four times a day (QID) | ORAL | 0 refills | Status: DC | PRN

## 2015-11-15 NOTE — ED Provider Notes (Signed)
[facility] DEPT Provider Note   CSN: [id number] Arrival date & time: 11/15/15  [id number]  First Provider Contact:  First MD Initiated Contact with Patient 11/15/15 0444        History   Chief Complaint Chief Complaint  Patient presents with  . Chest Pain    HPI Mitchell Adams is a 55 y.o. male.  Patient is a 55 year old male with no significant past medical history. He presents for evaluation of chest discomfort. He describes this as a heaviness sitting on his chest. He denies any alleviating factors, but does report that his symptoms are worse with coughing. He reports he started coughing yesterday and coughing to the point of vomiting on several occasions. He denies any fevers or chills. He denies any diaphoresis, shortness of breath, or radiation to the arm or jaw. He denies any recent exertional symptoms. The patient has no prior cardiac history and no cardiac risk factors.   The history is provided by the patient.    Past Medical History:  Diagnosis Date  . GERD (gastroesophageal reflux disease)    diagnosed with - +yes- GERD, occas. use of chewable antacid   . Hernia, umbilical     Patient Active Problem List   Diagnosis Date Noted  . Incisional hernia 04/29/2015    Past Surgical History:  Procedure Laterality Date  . ABDOMINAL EXPLORATION SURGERY  04/29/2015   w/LOA  . ABDOMINAL EXPLORATION SURGERY  2014   S/P colonoscopy w/multiple polyps removed/notes 04/29/2015  . ABDOMINAL SURGERY  2014   due to puncture of bowel after removal of polyps   . APPENDECTOMY  ~ 1987  . COLON SURGERY    . COLONOSCOPY W/ POLYPECTOMY  2014  . HERNIA REPAIR    . INCISIONAL HERNIA REPAIR  04/29/2015  . INCISIONAL HERNIA REPAIR N/A 04/29/2015   Procedure: OPEN INCISIONAL HERNIA REPAIR WITH MESH WITH MYOFACIAL RELEASE;  Surgeon: Rolm Bookbinder, MD;  Location: [facility];  Service: General;  Laterality: N/A;       Home Medications    Prior to Admission medications   Medication Sig  Start Date End Date Taking? Authorizing Provider  albuterol (PROVENTIL HFA;VENTOLIN HFA) 108 (90 Base) MCG/ACT inhaler Inhale 2 puffs into the lungs every 4 (four) hours as needed for wheezing or shortness of breath. 09/13/15   Montine Circle, PA-C  Metaxalone (SKELAXIN PO) Take 1 tablet by mouth.    Historical Provider, MD  pantoprazole (PROTONIX) 40 MG tablet Take one po BID x 2 weeks then take one po daily Patient taking differently: Take 40 mg by mouth 2 (two) times daily. Take one po BID x 2 weeks then take one po daily 08/25/15   Manus Gunning, MD  predniSONE (DELTASONE) 20 MG tablet Take 2 tablets (40 mg total) by mouth daily. 09/13/15   Montine Circle, PA-C  Probiotic Product (PROBIOTIC ADVANCED PO) Take 1 capsule by mouth 2 (two) times daily.    Historical Provider, MD  traMADol (ULTRAM) 50 MG tablet Take 50 mg by mouth every 6 (six) hours as needed for moderate pain.    Historical Provider, MD    Family History Family History  Problem Relation Age of Onset  . Diabetes Mother   . Heart disease Sister   . Diabetes Maternal Grandfather     Social History Social History  Substance Use Topics  . Smoking status: Former Smoker    Packs/day: 0.05    Years: 43.00    Types: Cigarettes    Quit date: 06/22/2014  .  Smokeless tobacco: Never Used  . Alcohol use Yes     Comment: 04/29/2015 "stopped drinking 11/2014"     Allergies   Penicillins   Review of Systems Review of Systems  All other systems reviewed and are negative.    Physical Exam Updated Vital Signs BP 120/82 (BP Location: Right Arm)   Pulse 92   Temp 98.2 F (36.8 C) (Oral)   Resp 20   Ht 5\' 7"  (1.702 m)   Wt 174 lb (78.9 kg)   SpO2 95%   BMI 27.25 kg/m   Physical Exam  Constitutional: He is oriented to person, place, and time. He appears well-developed and well-nourished. No distress.  Awake, alert, nontoxic appearance with baseline speech for patient.  HENT:  Head: Normocephalic and atraumatic.    Mouth/Throat: No oropharyngeal exudate.  Eyes: EOM are normal. Pupils are equal, round, and reactive to light. Right eye exhibits no discharge. Left eye exhibits no discharge.  Neck: Normal range of motion. Neck supple.  Cardiovascular: Normal rate, regular rhythm and normal heart sounds.   No murmur heard. Pulmonary/Chest: Effort normal and breath sounds normal. No stridor. No respiratory distress. He has no wheezes. He has no rales. He exhibits no tenderness.  Abdominal: Soft. Bowel sounds are normal. He exhibits no mass. There is no tenderness. There is no rebound.  Musculoskeletal: Normal range of motion. He exhibits no edema or tenderness.  Baseline ROM, moves extremities with no obvious new focal weakness.  Lymphadenopathy:    He has no cervical adenopathy.  Neurological: He is alert and oriented to person, place, and time.  Awake, alert, cooperative and aware of situation; motor strength bilaterally; sensation normal to light touch bilaterally; peripheral visual fields full to confrontation; no facial asymmetry; tongue midline; major cranial nerves appear intact; no pronator drift, normal finger to nose bilaterally, baseline gait without new ataxia.  Skin: No rash noted. He is not diaphoretic.  Psychiatric: He has a normal mood and affect.  Nursing note and vitals reviewed.    ED Treatments / Results  Labs (all labs ordered are listed, but only abnormal results are displayed) Labs Reviewed  [place], ED    EKG  EKG Interpretation  Date/Time:  Monday November 15 2015 04:01:46 EDT Ventricular Rate:  87 PR Interval:    QRS Duration: 102 QT Interval:  346 QTC Calculation: 417 R Axis:   80 Text Interpretation:  Sinus rhythm RSR' in V1 or V2, right VCD or RVH Confirmed by Coleston Dirosa  MD, Felita Bump ([id number]) on 11/15/2015 4:05:38 AM       Radiology Dg Chest 2 View  Result Date: 11/15/2015 CLINICAL DATA:  55 year old male with chest pain EXAM: CHEST   2 VIEW COMPARISON:  Chest radiograph dated 09/13/2015 FINDINGS: The heart size and mediastinal contours are within normal limits. Both lungs are clear. The visualized skeletal structures are unremarkable. IMPRESSION: No active cardiopulmonary disease. Electronically Signed   By: Anner Crete M.D.   On: 11/15/2015 04:46   Procedures Procedures (including critical care time)  Medications Ordered in ED Medications - No data to display   Initial Impression / Assessment and Plan / ED Course  I have reviewed the triage vital signs and the nursing notes.  Pertinent labs & imaging results that were available during my care of the patient were reviewed by me and considered in my medical decision making (see chart for details).  Clinical Course    Patient presents with complaints of cough  and chest discomfort. This began yesterday and is worsening. The patient reports coughing to the extent that he threw up and has vomited here in the ER. This vomiting has made him feel much better. His abdominal exam is benign and cardiac workup is negative. There is no infiltrate on the chest x-ray, EKG is normal, and troponin is negative.  His symptoms do not sound cardiac and he has no sign of pneumonia on his chest x-ray. This cough may be viral. I will treat him with cough medication and follow-up with his primary Dr. if not improving.  Final Clinical Impressions(s) / ED Diagnoses   Final diagnoses:  None    New Prescriptions New Prescriptions   No medications on file     Veryl Speak, MD 11/15/15 [phone]

## 2015-11-15 NOTE — ED Triage Notes (Signed)
Pt states that he began having a dry cough yesterday and then developed N/V; pt states that he has vomited 3 times since yesterday; pt c/o midsternal chest pain described as burning; pt states that the pain is worse with cough; pt states that he feels short of breath at times and that gets worse with exertion

## 2015-11-15 NOTE — ED Notes (Signed)
Pt reports improvement in pain at this time. Awaiting disposition by MD.

## 2015-11-15 NOTE — Discharge Instructions (Signed)
Robitussin with codeine as prescribed as needed for cough.  Return to the emergency department if your symptoms significantly worsen or change.

## 2015-11-15 NOTE — ED Notes (Signed)
Pt reports understanding of discharge information. No questions at time of discharge 

## 2016-02-02 ENCOUNTER — Encounter (HOSPITAL_BASED_OUTPATIENT_CLINIC_OR_DEPARTMENT_OTHER): Payer: Self-pay | Admitting: *Deleted

## 2016-02-02 ENCOUNTER — Other Ambulatory Visit: Payer: Self-pay | Admitting: Orthopedic Surgery

## 2016-02-08 NOTE — H&P (Signed)
Mitchell Adams is an 55 y.o. male.   Chief Complaint: Right Knee Pain  HPI: Mr. Mitchell Adams is a 55 year old male that is following up for right knee pain.  He received an injection on 01/25/16.  He had minimal improvement after that.  He has had x-rays performed which showed good articular cartilage height with minimal arthritic change.  He reports he has constant pain on a daily basis.  This pain is been ongoing for roughly 8 months.  He has tried a brace which seemed to make his pain worse.  He has tried prednisone with some improvement before.  His pain started with no inciting event.  He reports he has exacerbation of his pain associated with his work.  His pain is worse with any bending, prolonged sitting or climbing a ladder.  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  Past Medical History:  Diagnosis Date  . GERD (gastroesophageal reflux disease)    diagnosed with - +yes- GERD, occas. use of chewable antacid   . Hernia, umbilical     Past Surgical History:  Procedure Laterality Date  . ABDOMINAL EXPLORATION SURGERY  04/29/2015   w/LOA  . ABDOMINAL EXPLORATION SURGERY  2014   S/P colonoscopy w/multiple polyps removed/notes 04/29/2015  . ABDOMINAL SURGERY  2014   due to puncture of bowel after removal of polyps   . APPENDECTOMY  ~ 1987  . COLON SURGERY    . COLONOSCOPY W/ POLYPECTOMY  2014  . HERNIA REPAIR    . INCISIONAL HERNIA REPAIR  04/29/2015  . INCISIONAL HERNIA REPAIR N/A 04/29/2015   Procedure: OPEN INCISIONAL HERNIA REPAIR WITH MESH WITH MYOFACIAL RELEASE;  Surgeon: Rolm Bookbinder, MD;  Location: [facility] OR;  Service: General;  Laterality: N/A;    Family History  Problem Relation Age of Onset  . Diabetes Mother   . Heart disease Sister   . Diabetes Maternal Grandfather    Social History:  reports that he quit smoking about 19 months ago. His smoking use included Cigarettes. He has a 2.15 pack-year smoking history. He has never used smokeless tobacco. He reports that he  does not drink alcohol or use drugs.  Allergies:  Allergies  Allergen Reactions  . Penicillins Hives and Other (See Comments)    Has patient had a PCN reaction causing immediate rash, facial/tongue/throat swelling, SOB or lightheadedness with hypotension: Yes Has patient had a PCN reaction causing severe rash involving mucus membranes or skin necrosis: No Has patient had a PCN reaction that required hospitalization No Pt. Reports increased heart rate  & faint feeling  Has patient had a PCN reaction occurring within the last 10 years: No If all of the above answers are "NO", then may proceed with Cephalosporin use.     No prescriptions prior to admission.    No results found for this or any previous visit (from the past 48 hour(s)). No results found.  Review of Systems  Constitutional: Positive for diaphoresis.  HENT: Negative.        Sinus problems  Eyes:       Poor vision  Respiratory: Positive for shortness of breath.   Cardiovascular: Negative.   Gastrointestinal: Positive for abdominal pain, heartburn and nausea.  Genitourinary: Positive for urgency.       ED  Musculoskeletal: Positive for joint pain.  Skin: Negative.   Neurological: Negative.   Endo/Heme/Allergies: Negative.   Psychiatric/Behavioral: Negative.     Height 5\' 7"  (1.702 m), weight 78.9 kg (174 lb). Physical Exam  Constitutional: He is oriented to person, place, and time. He appears well-developed and well-nourished.  HENT:  Head: Normocephalic and atraumatic.  Eyes: Pupils are equal, round, and reactive to light.  Neck: Normal range of motion. Neck supple.  Cardiovascular: Intact distal pulses.   Respiratory: Effort normal.  Musculoskeletal: He exhibits tenderness.  Tenderness to palpation over the lateral medial joint line.  Normal knee flexion and extension.  Positive McMurray's test.  Neurovascularly intact.  Neurological: He is alert and oriented to person, place, and time.  Skin: Skin is warm  and dry.  Psychiatric: He has a normal mood and affect. His behavior is normal. Judgment and thought content normal.     Assessment/Plan Assessment: Right knee pain that is likely secondary to degenerative meniscal tear  Plan: Risks and benefits were discussed about arthroscopic surgery.  He wishes to pursue arthroscopic surgery at this point.  Discussed the option of getting an MRI but ultimately would not change the management of his care.  Alexander Aument R, PA-C 02/08/2016, 8:10 AM

## 2016-02-09 ENCOUNTER — Ambulatory Visit (HOSPITAL_BASED_OUTPATIENT_CLINIC_OR_DEPARTMENT_OTHER): Payer: BLUE CROSS/BLUE SHIELD | Admitting: Certified Registered"

## 2016-02-09 ENCOUNTER — Ambulatory Visit (HOSPITAL_BASED_OUTPATIENT_CLINIC_OR_DEPARTMENT_OTHER)
Admission: RE | Admit: 2016-02-09 | Discharge: 2016-02-09 | Disposition: A | Discharge status: Home / Self Care | Payer: BLUE CROSS/BLUE SHIELD | Source: Ambulatory Visit | Attending: Orthopedic Surgery | Admitting: Orthopedic Surgery

## 2016-02-09 ENCOUNTER — Encounter (HOSPITAL_BASED_OUTPATIENT_CLINIC_OR_DEPARTMENT_OTHER)
Admission: RE | Disposition: A | Discharge status: Home / Self Care | Payer: Self-pay | Source: Ambulatory Visit | Attending: Orthopedic Surgery

## 2016-02-09 ENCOUNTER — Encounter (HOSPITAL_BASED_OUTPATIENT_CLINIC_OR_DEPARTMENT_OTHER): Payer: Self-pay | Admitting: *Deleted

## 2016-02-09 DIAGNOSIS — M23221 Derangement of posterior horn of medial meniscus due to old tear or injury, right knee: Secondary | ICD-10-CM | POA: Insufficient documentation

## 2016-02-09 DIAGNOSIS — Z79899 Other long term (current) drug therapy: Secondary | ICD-10-CM | POA: Insufficient documentation

## 2016-02-09 DIAGNOSIS — M2241 Chondromalacia patellae, right knee: Secondary | ICD-10-CM | POA: Diagnosis not present

## 2016-02-09 DIAGNOSIS — Z87891 Personal history of nicotine dependence: Secondary | ICD-10-CM | POA: Diagnosis not present

## 2016-02-09 DIAGNOSIS — M23231 Derangement of other medial meniscus due to old tear or injury, right knee: Secondary | ICD-10-CM | POA: Insufficient documentation

## 2016-02-09 DIAGNOSIS — K219 Gastro-esophageal reflux disease without esophagitis: Secondary | ICD-10-CM | POA: Diagnosis not present

## 2016-02-09 DIAGNOSIS — S83241A Other tear of medial meniscus, current injury, right knee, initial encounter: Secondary | ICD-10-CM

## 2016-02-09 HISTORY — PX: KNEE ARTHROSCOPY: SHX127

## 2016-02-09 SURGERY — ARTHROSCOPY, KNEE
Anesthesia: General | Site: Knee | Laterality: Right

## 2016-02-09 MED ORDER — PROMETHAZINE HCL 25 MG/ML IJ SOLN: 6.2500 mg | INTRAMUSCULAR | Status: DC | PRN

## 2016-02-09 MED ORDER — BUPIVACAINE-EPINEPHRINE (PF) 0.25% -1:200000 IJ SOLN
INTRAMUSCULAR | Status: DC | PRN
  Administered 2016-02-09: 20 mL via PERINEURAL

## 2016-02-09 MED ORDER — DEXAMETHASONE SODIUM PHOSPHATE 10 MG/ML IJ SOLN
INTRAMUSCULAR | Status: DC | PRN
  Administered 2016-02-09: 10 mg via INTRAVENOUS

## 2016-02-09 MED ORDER — OXYCODONE HCL 5 MG/5ML PO SOLN: 5.0000 mg | Freq: Once | ORAL | Status: DC | PRN

## 2016-02-09 MED ORDER — GLYCOPYRROLATE 0.2 MG/ML IJ SOLN: 0.2000 mg | Freq: Once | INTRAMUSCULAR | Status: DC | PRN

## 2016-02-09 MED ORDER — MEPERIDINE HCL 25 MG/ML IJ SOLN: 6.2500 mg | INTRAMUSCULAR | Status: DC | PRN

## 2016-02-09 MED ORDER — OXYCODONE-ACETAMINOPHEN 5-325 MG PO TABS: 1.0000 | ORAL_TABLET | Freq: Four times a day (QID) | ORAL | 0 refills | Status: DC | PRN

## 2016-02-09 MED ORDER — VANCOMYCIN HCL IN DEXTROSE 1-5 GM/200ML-% IV SOLN
INTRAVENOUS | Status: AC
  Filled 2016-02-09: qty 200

## 2016-02-09 MED ORDER — HYDROMORPHONE HCL 1 MG/ML IJ SOLN
0.2500 mg | INTRAMUSCULAR | Status: DC | PRN
  Administered 2016-02-09 (×2): 0.5 mg via INTRAVENOUS

## 2016-02-09 MED ORDER — LACTATED RINGERS IV SOLN: INTRAVENOUS | Status: DC

## 2016-02-09 MED ORDER — OXYCODONE HCL 5 MG PO TABS
ORAL_TABLET | ORAL | Status: AC
  Filled 2016-02-09: qty 1

## 2016-02-09 MED ORDER — FENTANYL CITRATE (PF) 100 MCG/2ML IJ SOLN
INTRAMUSCULAR | Status: AC
  Filled 2016-02-09: qty 2

## 2016-02-09 MED ORDER — FENTANYL CITRATE (PF) 100 MCG/2ML IJ SOLN
50.0000 ug | INTRAMUSCULAR | Status: AC | PRN
  Administered 2016-02-09 (×2): 50 ug via INTRAVENOUS
  Administered 2016-02-09: 25 ug via INTRAVENOUS

## 2016-02-09 MED ORDER — LACTATED RINGERS IV SOLN
INTRAVENOUS | Status: DC
  Administered 2016-02-09 (×2): via INTRAVENOUS

## 2016-02-09 MED ORDER — KETOROLAC TROMETHAMINE 30 MG/ML IJ SOLN
INTRAMUSCULAR | Status: AC
  Filled 2016-02-09: qty 1

## 2016-02-09 MED ORDER — MIDAZOLAM HCL 2 MG/2ML IJ SOLN
INTRAMUSCULAR | Status: AC
Start: 2016-02-09 — End: 2016-02-09
  Filled 2016-02-09: qty 2

## 2016-02-09 MED ORDER — SODIUM CHLORIDE 0.9 % IR SOLN
Status: DC | PRN
  Administered 2016-02-09 (×2): 3000 mL

## 2016-02-09 MED ORDER — OXYCODONE HCL 5 MG PO TABS: 5.0000 mg | ORAL_TABLET | Freq: Once | ORAL | Status: DC | PRN

## 2016-02-09 MED ORDER — KETOROLAC TROMETHAMINE 30 MG/ML IJ SOLN
30.0000 mg | Freq: Once | INTRAMUSCULAR | Status: AC
  Administered 2016-02-09: 30 mg via INTRAVENOUS

## 2016-02-09 MED ORDER — ONDANSETRON HCL 4 MG/2ML IJ SOLN
INTRAMUSCULAR | Status: DC | PRN
  Administered 2016-02-09: 4 mg via INTRAVENOUS

## 2016-02-09 MED ORDER — VANCOMYCIN HCL IN DEXTROSE 1-5 GM/200ML-% IV SOLN: 1000.0000 mg | INTRAVENOUS | Status: DC

## 2016-02-09 MED ORDER — DEXTROSE-NACL 5-0.45 % IV SOLN: INTRAVENOUS | Status: DC

## 2016-02-09 MED ORDER — MIDAZOLAM HCL 2 MG/2ML IJ SOLN
1.0000 mg | INTRAMUSCULAR | Status: DC | PRN
  Administered 2016-02-09: 2 mg via INTRAVENOUS

## 2016-02-09 MED ORDER — EPINEPHRINE 30 MG/30ML IJ SOLN
INTRAMUSCULAR | Status: AC
  Filled 2016-02-09: qty 1

## 2016-02-09 MED ORDER — HYDROMORPHONE HCL 1 MG/ML IJ SOLN
INTRAMUSCULAR | Status: AC
  Filled 2016-02-09: qty 1

## 2016-02-09 MED ORDER — EPHEDRINE 5 MG/ML INJ
INTRAVENOUS | Status: AC
  Filled 2016-02-09: qty 10

## 2016-02-09 MED ORDER — CHLORHEXIDINE GLUCONATE 4 % EX LIQD: 60.0000 mL | Freq: Once | CUTANEOUS | Status: DC

## 2016-02-09 MED ORDER — LIDOCAINE HCL (CARDIAC) 20 MG/ML IV SOLN
INTRAVENOUS | Status: DC | PRN
  Administered 2016-02-09: 60 mg via INTRAVENOUS

## 2016-02-09 MED ORDER — BUPIVACAINE HCL (PF) 0.5 % IJ SOLN
INTRAMUSCULAR | Status: AC
  Filled 2016-02-09: qty 30

## 2016-02-09 MED ORDER — PROPOFOL 10 MG/ML IV BOLUS
INTRAVENOUS | Status: DC | PRN
  Administered 2016-02-09: 200 mg via INTRAVENOUS

## 2016-02-09 MED ORDER — SCOPOLAMINE 1 MG/3DAYS TD PT72: 1.0000 | MEDICATED_PATCH | Freq: Once | TRANSDERMAL | Status: DC | PRN

## 2016-02-09 SURGICAL SUPPLY — 38 items
BANDAGE ACE 6X5 VEL STRL LF (GAUZE/BANDAGES/DRESSINGS) ×2 IMPLANT
BLADE 4.2CUDA (BLADE) IMPLANT
BLADE CUTTER GATOR 3.5 (BLADE) IMPLANT
BLADE GREAT WHITE 4.2 (BLADE) IMPLANT
BNDG COHESIVE 6X5 TAN STRL LF (GAUZE/BANDAGES/DRESSINGS) ×2 IMPLANT
DRAPE ARTHROSCOPY W/POUCH 114 (DRAPES) ×2 IMPLANT
DURAPREP 26ML APPLICATOR (WOUND CARE) ×2 IMPLANT
ELECT MENISCUS 165MM 90D (ELECTRODE) IMPLANT
ELECT REM PT RETURN 9FT ADLT (ELECTROSURGICAL)
ELECTRODE REM PT RTRN 9FT ADLT (ELECTROSURGICAL) IMPLANT
GAUZE SPONGE 4X4 12PLY STRL (GAUZE/BANDAGES/DRESSINGS) ×2 IMPLANT
GAUZE XEROFORM 1X8 LF (GAUZE/BANDAGES/DRESSINGS) ×2 IMPLANT
GLOVE BIO SURGEON STRL SZ7.5 (GLOVE) ×2 IMPLANT
GLOVE BIO SURGEON STRL SZ8.5 (GLOVE) ×2 IMPLANT
GLOVE BIOGEL PI IND STRL 8 (GLOVE) ×1 IMPLANT
GLOVE BIOGEL PI IND STRL 9 (GLOVE) ×1 IMPLANT
GLOVE BIOGEL PI INDICATOR 8 (GLOVE) ×1
GLOVE BIOGEL PI INDICATOR 9 (GLOVE) ×1
GOWN STRL REUS W/ TWL LRG LVL3 (GOWN DISPOSABLE) ×2 IMPLANT
GOWN STRL REUS W/TWL LRG LVL3 (GOWN DISPOSABLE) ×2
GOWN STRL REUS W/TWL XL LVL3 (GOWN DISPOSABLE) ×2 IMPLANT
IV NS IRRIG 3000ML ARTHROMATIC (IV SOLUTION) ×2 IMPLANT
KNEE WRAP E Z 3 GEL PACK (MISCELLANEOUS) ×2 IMPLANT
MANIFOLD NEPTUNE II (INSTRUMENTS) IMPLANT
NDL SAFETY ECLIPSE 18X1.5 (NEEDLE) ×1 IMPLANT
NEEDLE HYPO 18GX1.5 SHARP (NEEDLE) ×1
PACK ARTHROSCOPY DSU (CUSTOM PROCEDURE TRAY) ×2 IMPLANT
PACK BASIN DAY SURGERY FS (CUSTOM PROCEDURE TRAY) ×2 IMPLANT
PAD ALCOHOL SWAB (MISCELLANEOUS) ×2 IMPLANT
PENCIL BUTTON HOLSTER BLD 10FT (ELECTRODE) IMPLANT
PROBE BIPOLAR ATHRO 135MM 90D (MISCELLANEOUS) IMPLANT
SET ARTHROSCOPY TUBING (MISCELLANEOUS) ×1
SET ARTHROSCOPY TUBING LN (MISCELLANEOUS) ×1 IMPLANT
SLEEVE SCD COMPRESS KNEE MED (MISCELLANEOUS) IMPLANT
SYR 3ML 18GX1 1/2 (SYRINGE) IMPLANT
SYR 5ML LL (SYRINGE) ×2 IMPLANT
TOWEL OR 17X24 6PK STRL BLUE (TOWEL DISPOSABLE) ×2 IMPLANT
WATER STERILE IRR 1000ML POUR (IV SOLUTION) ×2 IMPLANT

## 2016-02-09 NOTE — Interval H&P Note (Signed)
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 After consideration of risks, benefits and other options for treatment, the patient has consented to  Procedure(s): ARTHROSCOPY KNEE (Right) as a surgical intervention .  The patient's history has been reviewed, patient examined, no change in status, stable for surgery.  I have reviewed the patient's chart and labs.  Questions were answered to the patient's satisfaction.     Kerin Salen

## 2016-02-09 NOTE — Op Note (Signed)
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 Mayer Camel M.D.  Assist: Kerry Hough. Barton Dubois  (present throughout entire procedure and necessary for timely completion of the procedure) Anes: General LMA  EBL: Minimal  Fluids: 800 cc   Indications: Catching popping and pain in the right knee for 1 year. Pt has failed conservative treatment with anti-inflammatory medicines, physical therapy, and modified activites but did get good temporarily from an intra-articular cortisone injection. Pain has recurred and patient desires elective arthroscopic evaluation and treatment of knee. Risks and benefits of surgery have been discussed and questions answered.  Procedure: Patient identified by arm band and taken to the operating room at the day surgery Center. The appropriate anesthetic monitors were attached, and General LMA anesthesia was induced without difficulty. Lateral post was applied to the table and the lower extremity was prepped and draped in usual sterile fashion from the ankle to the midthigh. Time out procedure was performed. The right knee was noted to have a 2+ effusion preoperatively We began the operation by making standard inferior lateral and inferior medial peripatellar portals with a #11 blade allowing introduction of the arthroscope through the inferior lateral portal and the out flow to the inferior medial portal. Pump pressure was set at 100 mmHg and diagnostic arthroscopy  revealed minimal chondromalacia of the patellofemoral joint, moving into the medial compartment we identified extensive degenerative tearing of the medial and posterior horns of the medial meniscus, debrided back to a stable margin with  a 3.5 mm Gator sucker shaver. The anterior cruciate ligament and the PCL are intact. Lateral compartment was in excellent condition with minimal degenerative tearing lateral meniscus incidentally debrided. The gutters were cleared medially and laterally.. The knee was irrigated out normal saline solution. A dressing of xerofoam 4 x 4 dressing sponges, web roll and an Ace wrap was applied. The patient was awakened extubated and taken to the recovery without difficulty.    Signed: Kerin Salen, MD

## 2016-02-09 NOTE — Anesthesia Preprocedure Evaluation (Addendum)
Anesthesia Evaluation  Patient identified by MRN, date of birth, ID band Patient awake    Reviewed: Allergy & Precautions, NPO status , Patient's Chart, lab work & pertinent test results  Airway Mallampati: I  TM Distance: >3 FB Neck ROM: Full    Dental  (+) Teeth Intact, Dental Advisory Given   Pulmonary former smoker,    breath sounds clear to auscultation       Cardiovascular negative cardio ROS   Rhythm:Regular Rate:Normal     Neuro/Psych negative neurological ROS  negative psych ROS   GI/Hepatic Neg liver ROS, GERD  ,  Endo/Other  negative endocrine ROS  Renal/GU negative Renal ROS  negative genitourinary   Musculoskeletal negative musculoskeletal ROS (+)   Abdominal   Peds negative pediatric ROS (+)  Hematology negative hematology ROS (+)   Anesthesia Other Findings   Reproductive/Obstetrics negative OB ROS                            Lab Results  Component Value Date   WBC 7.0 11/15/2015   HGB 13.8 11/15/2015   HCT 41.3 11/15/2015   MCV 95.8 11/15/2015   PLT 235 11/15/2015   Lab Results  Component Value Date   CREATININE 0.89 11/15/2015   BUN 16 11/15/2015   NA 140 11/15/2015   K 3.6 11/15/2015   CL 109 11/15/2015   CO2 23 11/15/2015   Lab Results  Component Value Date   INR 0.96 04/22/2015   10/2015 EKG: normal sinus rhythm.  Anesthesia Physical Anesthesia Plan  ASA: II  Anesthesia Plan: General   Post-op Pain Management:    Induction: Intravenous  Airway Management Planned: LMA  Additional Equipment:   Intra-op Plan:   Post-operative Plan: Extubation in OR  Informed Consent: I have reviewed the patients History and Physical, chart, labs and discussed the procedure including the risks, benefits and alternatives for the proposed anesthesia with the patient or authorized representative who has indicated his/her understanding and acceptance.   Dental  advisory given  Plan Discussed with: CRNA  Anesthesia Plan Comments:         Anesthesia Quick Evaluation

## 2016-02-09 NOTE — Anesthesia Procedure Notes (Addendum)
Procedure Name: LMA Insertion Date/Time: 02/09/2016 12:25 PM Performed by: Valor Turber[place] D Pre-anesthesia Checklist: Patient identified, Emergency Drugs available, Suction available and Patient being monitored Patient Re-evaluated:Patient Re-evaluated prior to inductionOxygen Delivery Method: Circle system utilized Preoxygenation: Pre-oxygenation with 100% oxygen Intubation Type: IV induction Ventilation: Mask ventilation without difficulty LMA: LMA inserted LMA Size: 4.0 Number of attempts: 1 Airway Equipment and Method: Bite block Placement Confirmation: positive ETCO2 Tube secured with: Tape Dental Injury: Teeth and Oropharynx as per pre-operative assessment

## 2016-02-09 NOTE — Transfer of Care (Signed)
Immediate Anesthesia Transfer of Care Note  Patient: Mitchell Adams  Procedure(s) Performed: Procedure(s) with comments: ARTHROSCOPY KNEE (Right) - ARTHROSCOPY KNEE, medial meniscal tear, chondromalacia  Patient Location: PACU  Anesthesia Type:General  Level of Consciousness: awake and patient cooperative  Airway & Oxygen Therapy: Patient Spontanous Breathing and Patient connected to face mask oxygen  Post-op Assessment: Report given to RN and Post -op Vital signs reviewed and stable  Post vital signs: Reviewed and stable  Last Vitals:  Vitals:   02/09/16 1047 02/09/16 1300  BP: 110/77 101/64  Pulse: 70 68  Resp: 18 11  Temp: 36.7 C     Last Pain:  Vitals:   02/09/16 1047  TempSrc: Oral  PainSc: 8          Complications: No apparent anesthesia complications

## 2016-02-09 NOTE — Anesthesia Postprocedure Evaluation (Signed)
Anesthesia Post Note  Patient: Mitchell Adams  Procedure(s) Performed: Procedure(s) (LRB): ARTHROSCOPY KNEE (Right)  Patient location during evaluation: PACU Anesthesia Type: General Level of consciousness: awake and alert Pain management: pain level controlled Vital Signs Assessment: post-procedure vital signs reviewed and stable Respiratory status: spontaneous breathing, nonlabored ventilation, respiratory function stable and patient connected to nasal cannula oxygen Cardiovascular status: blood pressure returned to baseline and stable Postop Assessment: no signs of nausea or vomiting Anesthetic complications: no    Last Vitals:  Vitals:   02/09/16 1345 02/09/16 1400  BP: 127/81 127/85  Pulse: 72 78  Resp: 11 15  Temp:      Last Pain:  Vitals:   02/09/16 1400  TempSrc:   PainSc: 6         RLE Motor Response: Purposeful movement (02/09/16 1400) RLE Sensation: Numbness;Pain (02/09/16 1400)      Effie Berkshire

## 2016-02-09 NOTE — Discharge Instructions (Addendum)
Knee Arthroscopy, Care After Refer to this sheet in the next few weeks. These instructions provide you with information about caring for yourself after your procedure. Your health care provider may also give you more specific instructions. Your treatment has been planned according to current medical practices, but problems sometimes occur. Call your health care provider if you have any problems or questions after your procedure. WHAT TO EXPECT AFTER THE PROCEDURE After your procedure, it is common to have:  Soreness.  Pain. HOME CARE INSTRUCTIONS Bathing  Do not take baths, swim, or use a hot tub until your health care provider approves. Incision Care  There are many different ways to close and cover an incision, including stitches, skin glue, and adhesive strips. Follow your health care provider's instructions about:  Incision care.  Bandage (dressing) changes and removal.  Incision closure removal.  Check your incision area every day for signs of infection. Watch for:  Redness, swelling, or pain.  Fluid, blood, or pus. Activity  Avoid strenuous activities for as long as directed by your health care provider.  Return to your normal activities as directed by your health care provider. Ask your health care provider what activities are safe for you.  Perform range-of-motion exercises only as directed by your health care provider.  Do not lift anything that is heavier than 10 lb (4.5 kg).  Do not drive or operate heavy machinery while taking pain medicine.  If you were given crutches, use them as directed by your health care provider. Managing Pain, Stiffness, and Swelling  If directed, apply ice to the injured area:  Put ice in a plastic bag.  Place a towel between your skin and the bag.  Leave the ice on for 20 minutes, 2-3 times per day.  Raise the injured area above the level of your heart while you are sitting or lying down as directed by your health care  provider. General Instructions  Keep all follow-up visits as directed by your health care provider. This is important.  Take medicines only as directed by your health care provider.  Do not use any tobacco products, including cigarettes, chewing tobacco, or electronic cigarettes. If you need help quitting, ask your health care provider.  If you were given compression stockings, wear them as directed by your health care provider. These stockings help prevent blood clots and reduce swelling in your legs. SEEK MEDICAL CARE IF:  You have severe pain with any movement of your knee.  You notice a bad smell coming from the incision or dressing.  You have redness, swelling, or pain at the site of your incision.  You have fluid, blood, or pus coming from your incision. SEEK IMMEDIATE MEDICAL CARE IF:  You develop a rash.  You have a fever.  You have difficulty breathing or have shortness of breath.  You develop pain in your calves or in the back of your knee.  You develop chest pain.  You develop numbness or tingling in your leg or foot.   This information is not intended to replace advice given to you by your health care provider. Make sure you discuss any questions you have with your health care provider.   Document Released: 10/28/2004 Document Revised: 08/25/2014 Document Reviewed: 04/06/2014 Elsevier Interactive Patient Education 2016 [facility] Care Instructions  Activity: Get plenty of rest for the remainder of the day. A responsible adult should stay with you for 24 hours following the procedure.  For the next  24 hours, DO NOT: -Drive a car -Paediatric nurse -Drink alcoholic beverages -Take any medication unless instructed by your physician -Make any legal decisions or sign important papers.  Meals: Start with liquid foods such as gelatin or soup. Progress to regular foods as tolerated. Avoid greasy, spicy, heavy foods. If nausea and/or  vomiting occur, drink only clear liquids until the nausea and/or vomiting subsides. Call your physician if vomiting continues.  Special Instructions/Symptoms: Your throat may feel dry or sore from the anesthesia or the breathing tube placed in your throat during surgery. If this causes discomfort, gargle with warm salt water. The discomfort should disappear within 24 hours.  If you had a scopolamine patch placed behind your ear for the management of post- operative nausea and/or vomiting:  1. The medication in the patch is effective for 72 hours, after which it should be removed.  Wrap patch in a tissue and discard in the trash. Wash hands thoroughly with soap and water. 2. You may remove the patch earlier than 72 hours if you experience unpleasant side effects which may include dry mouth, dizziness or visual disturbances. 3. Avoid touching the patch. Wash your hands with soap and water after contact with the patch.

## 2016-02-10 ENCOUNTER — Encounter (HOSPITAL_BASED_OUTPATIENT_CLINIC_OR_DEPARTMENT_OTHER): Payer: Self-pay | Admitting: Orthopedic Surgery

## 2016-05-01 ENCOUNTER — Emergency Department (HOSPITAL_BASED_OUTPATIENT_CLINIC_OR_DEPARTMENT_OTHER): Payer: Worker's Compensation

## 2016-05-01 ENCOUNTER — Emergency Department (HOSPITAL_BASED_OUTPATIENT_CLINIC_OR_DEPARTMENT_OTHER)
Admission: EM | Admit: 2016-05-01 | Discharge: 2016-05-01 | Disposition: A | Discharge status: Home / Self Care | Payer: Worker's Compensation | Attending: Emergency Medicine | Admitting: Emergency Medicine

## 2016-05-01 ENCOUNTER — Encounter (HOSPITAL_BASED_OUTPATIENT_CLINIC_OR_DEPARTMENT_OTHER): Payer: Self-pay

## 2016-05-01 DIAGNOSIS — W450XXA Nail entering through skin, initial encounter: Secondary | ICD-10-CM | POA: Diagnosis not present

## 2016-05-01 DIAGNOSIS — Y939 Activity, unspecified: Secondary | ICD-10-CM | POA: Diagnosis not present

## 2016-05-01 DIAGNOSIS — Y999 Unspecified external cause status: Secondary | ICD-10-CM | POA: Insufficient documentation

## 2016-05-01 DIAGNOSIS — S61022A Laceration with foreign body of left thumb without damage to nail, initial encounter: Secondary | ICD-10-CM

## 2016-05-01 DIAGNOSIS — Y929 Unspecified place or not applicable: Secondary | ICD-10-CM | POA: Insufficient documentation

## 2016-05-01 DIAGNOSIS — S6992XA Unspecified injury of left wrist, hand and finger(s), initial encounter: Secondary | ICD-10-CM | POA: Diagnosis present

## 2016-05-01 DIAGNOSIS — Z87891 Personal history of nicotine dependence: Secondary | ICD-10-CM | POA: Diagnosis not present

## 2016-05-01 MED ORDER — CLINDAMYCIN HCL 150 MG PO CAPS: 150.0000 mg | ORAL_CAPSULE | Freq: Four times a day (QID) | ORAL | 0 refills | Status: DC

## 2016-05-01 MED ORDER — LIDOCAINE HCL 1 % IJ SOLN
INTRAMUSCULAR | Status: AC
  Filled 2016-05-01: qty 20

## 2016-05-01 MED ORDER — TETANUS-DIPHTH-ACELL PERTUSSIS 5-2.5-18.5 LF-MCG/0.5 IM SUSP: 0.5000 mL | Freq: Once | INTRAMUSCULAR | Status: DC

## 2016-05-01 MED ORDER — LIDOCAINE HCL (PF) 1 % IJ SOLN: 5.0000 mL | Freq: Once | INTRAMUSCULAR | Status: DC

## 2016-05-01 MED FILL — CLINDAMYCIN HCL 150 MG CAP: 150 | 4 days supply | Qty: 16 | Fill #0

## 2016-05-01 NOTE — Discharge Instructions (Signed)
Watch for signs of infection

## 2016-05-01 NOTE — ED Notes (Signed)
Patient has a nail through his left thumb. Patient is unable to move it dur to injury, nail bed WNL. Cap refill under 3 sec

## 2016-05-01 NOTE — ED Notes (Signed)
UDs completed

## 2016-05-01 NOTE — ED Triage Notes (Addendum)
Pt with nail through wood through left thumb approx 15 min at PTA at [facility] with pt-NAD-steady gait

## 2016-05-01 NOTE — ED Notes (Signed)
ED Provider at bedside. 

## 2016-05-01 NOTE — ED Provider Notes (Signed)
[facility] DEPT [facility] Provider Note   CSN: [id number] Arrival date & time: 05/01/16  1203     History   Chief Complaint Chief Complaint  Patient presents with  . Hand Injury    HPI Mitchell Adams is a 56 y.o. male.  HPI  Patient accidentally got a nail through his left thumb with a nail gun. Unknown last tetanus. No other injury. He is right-handed.   Past Medical History:  Diagnosis Date  . GERD (gastroesophageal reflux disease)    diagnosed with - +yes- GERD, occas. use of chewable antacid   . Hernia, umbilical     Patient Active Problem List   Diagnosis Date Noted  . Incisional hernia 04/29/2015    Past Surgical History:  Procedure Laterality Date  . ABDOMINAL EXPLORATION SURGERY  04/29/2015   w/LOA  . ABDOMINAL EXPLORATION SURGERY  2014   S/P colonoscopy w/multiple polyps removed/notes 04/29/2015  . ABDOMINAL SURGERY  2014   due to puncture of bowel after removal of polyps   . APPENDECTOMY  ~ 1987  . COLON SURGERY    . COLONOSCOPY W/ POLYPECTOMY  2014  . HERNIA REPAIR    . INCISIONAL HERNIA REPAIR  04/29/2015  . INCISIONAL HERNIA REPAIR N/A 04/29/2015   Procedure: OPEN INCISIONAL HERNIA REPAIR WITH MESH WITH MYOFACIAL RELEASE;  Surgeon: Rolm Bookbinder, MD;  Location: [place];  Service: General;  Laterality: N/A;  . KNEE ARTHROSCOPY Right 02/09/2016   Procedure: ARTHROSCOPY KNEE;  Surgeon: Frederik Pear, MD;  Location: [place];  Service: Orthopedics;  Laterality: Right;  ARTHROSCOPY KNEE, medial meniscal tear, chondromalacia       Home Medications    Prior to Admission medications   Medication Sig Start Date End Date Taking? Authorizing Provider  clindamycin (CLEOCIN) 150 MG capsule Take 1 capsule (150 mg total) by mouth every 6 (six) hours. 05/01/16   Davonna Belling, MD    Family History Family History  Problem Relation Age of Onset  . Diabetes Mother   . Heart disease Sister   . Diabetes Maternal Grandfather     Social  History Social History  Substance Use Topics  . Smoking status: Former Smoker    Packs/day: 0.05    Years: 43.00    Types: Cigarettes    Quit date: 06/22/2014  . Smokeless tobacco: Never Used  . Alcohol use No     Allergies   Penicillins   Review of Systems Review of Systems  Constitutional: Negative for fever.  Musculoskeletal:       Nail through left thumb.  Skin: Positive for wound.  Neurological: Negative for weakness.     Physical Exam Updated Vital Signs BP 120/95 (BP Location: Right Arm)   Pulse 84   Temp 98.2 F (36.8 C) (Oral)   Resp 22   Ht 5' 7.5" (1.715 m)   Wt 174 lb (78.9 kg)   SpO2 97%   BMI 26.85 kg/m   Physical Exam  Constitutional: He appears well-developed.  Musculoskeletal:  Nail through palmar aspect of distal phalanx of left thumb. Sensation intact distally. Good capillary refill.  Neurological: He is alert.  Skin: Capillary refill takes less than 2 seconds.       ED Treatments / Results  Labs (all labs ordered are listed, but only abnormal results are displayed) Labs Reviewed - No data to display  EKG  EKG Interpretation None       Radiology Dg Finger Thumb Left  Result Date: 05/01/2016 CLINICAL DATA:  Nail through  thumb, been removed. EXAM: LEFT THUMB 2+V COMPARISON:  None. FINDINGS: No acute bony abnormality. No fracture, subluxation or dislocation. No radiopaque foreign bodies. IMPRESSION: Negative. Electronically Signed   By: Rolm Baptise M.D.   On: 05/01/2016 13:07    Procedures .Foreign Body Removal Date/Time: 05/01/2016 1:19 PM Performed by: Davonna Belling Authorized by: Davonna Belling  Consent: Verbal consent obtained. Written consent not obtained. Risks and benefits: risks, benefits and alternatives were discussed Body area: skin General location: upper extremity Location details: left thumb Anesthesia: digital block and local infiltration  Anesthesia: Anesthetic total: 3 mL  Sedation: Patient  sedated: no Patient restrained: no Patient cooperative: yes Localization method: visualized Dressing: dressing applied Tendon involvement: none Depth: deep Complexity: simple 1 objects recovered. Objects recovered: board with nail Post-procedure assessment: foreign body removed Patient tolerance: Patient tolerated the procedure well with no immediate complications   (including critical care time)  Medications Ordered in ED Medications  lidocaine (PF) (XYLOCAINE) 1 % injection 5 mL (not administered)  Tdap (BOOSTRIX) injection 0.5 mL (not administered)  lidocaine (XYLOCAINE) 1 % (with pres) injection (not administered)     Initial Impression / Assessment and Plan / ED Course  I have reviewed the triage vital signs and the nursing notes.  Pertinent labs & imaging results that were available during my care of the patient were reviewed by me and considered in my medical decision making (see chart for details).  Clinical Course     Patient with nail through thumb. Does not appear to involve the bone. Prophylactic antibodies given. Removed after numbing up. Irrigated and will be discharged.  Final Clinical Impressions(s) / ED Diagnoses   Final diagnoses:  Laceration of left thumb with foreign body without damage to nail, initial encounter    New Prescriptions New Prescriptions   CLINDAMYCIN (CLEOCIN) 150 MG CAPSULE    Take 1 capsule (150 mg total) by mouth every 6 (six) hours.     Davonna Belling, MD 05/01/16 1321

## 2016-10-03 ENCOUNTER — Encounter (HOSPITAL_COMMUNITY): Payer: Self-pay | Admitting: *Deleted

## 2016-10-03 DIAGNOSIS — Z87891 Personal history of nicotine dependence: Secondary | ICD-10-CM | POA: Insufficient documentation

## 2016-10-03 DIAGNOSIS — C61 Malignant neoplasm of prostate: Secondary | ICD-10-CM | POA: Diagnosis not present

## 2016-10-03 DIAGNOSIS — W57XXXD Bitten or stung by nonvenomous insect and other nonvenomous arthropods, subsequent encounter: Secondary | ICD-10-CM | POA: Insufficient documentation

## 2016-10-03 DIAGNOSIS — S70262D Insect bite (nonvenomous), left hip, subsequent encounter: Secondary | ICD-10-CM | POA: Diagnosis not present

## 2016-10-03 NOTE — ED Triage Notes (Signed)
Pt reports tick on his side that he noticed today. Pt reports he is a cancer patient and didn't want to remove tick at home.

## 2016-10-04 ENCOUNTER — Emergency Department (HOSPITAL_COMMUNITY)
Admission: EM | Admit: 2016-10-04 | Discharge: 2016-10-04 | Disposition: A | Discharge status: Home / Self Care | Payer: BLUE CROSS/BLUE SHIELD | Attending: Emergency Medicine | Admitting: Emergency Medicine

## 2016-10-04 DIAGNOSIS — W57XXXA Bitten or stung by nonvenomous insect and other nonvenomous arthropods, initial encounter: Secondary | ICD-10-CM

## 2016-10-04 HISTORY — DX: Malignant (primary) neoplasm, unspecified: C80.1

## 2016-10-04 NOTE — ED Provider Notes (Signed)
[facility] DEPT Provider Note   CSN: [id number] Arrival date & time: 10/03/16  2340     History   Chief Complaint Chief Complaint  Patient presents with  . Tick Removal    HPI Mitchell Adams is a 56 y.o. male.  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  This evening he was afraid to remove it himself as he is currently taking oral chemotherapy for stage IV prostate cancer.      Past Medical History:  Diagnosis Date  . Cancer [facility])    Stage 4 prostate  . GERD (gastroesophageal reflux disease)    diagnosed with - +yes- GERD, occas. use of chewable antacid   . Hernia, umbilical     Patient Active Problem List   Diagnosis Date Noted  . Incisional hernia 04/29/2015    Past Surgical History:  Procedure Laterality Date  . ABDOMINAL EXPLORATION SURGERY  04/29/2015   w/LOA  . ABDOMINAL EXPLORATION SURGERY  2014   S/P colonoscopy w/multiple polyps removed/notes 04/29/2015  . ABDOMINAL SURGERY  2014   due to puncture of bowel after removal of polyps   . APPENDECTOMY  ~ 1987  . COLON SURGERY    . COLONOSCOPY W/ POLYPECTOMY  2014  . HERNIA REPAIR    . INCISIONAL HERNIA REPAIR  04/29/2015  . INCISIONAL HERNIA REPAIR N/A 04/29/2015   Procedure: OPEN INCISIONAL HERNIA REPAIR WITH MESH WITH MYOFACIAL RELEASE;  Surgeon: Rolm Bookbinder, MD;  Location: [place];  Service: General;  Laterality: N/A;  . KNEE ARTHROSCOPY Right 02/09/2016   Procedure: ARTHROSCOPY KNEE;  Surgeon: Frederik Pear, MD;  Location: [place];  Service: Orthopedics;  Laterality: Right;  ARTHROSCOPY KNEE, medial meniscal tear, chondromalacia       Home Medications    Prior to Admission medications   Medication Sig Start Date End Date Taking? Authorizing Provider  clindamycin (CLEOCIN) 150 MG capsule Take 1 capsule (150 mg total) by mouth every 6 (six) hours. 05/01/16   Davonna Belling, MD    Family History Family History  Problem Relation Age of Onset   . Diabetes Mother   . Heart disease Sister   . Diabetes Maternal Grandfather     Social History Social History  Substance Use Topics  . Smoking status: Former Smoker    Packs/day: 0.05    Years: 43.00    Types: Cigarettes    Quit date: 06/22/2014  . Smokeless tobacco: Never Used  . Alcohol use No     Allergies   Penicillins   Review of Systems Review of Systems  Musculoskeletal: Negative for myalgias.  Skin: Negative for color change and wound.  All other systems reviewed and are negative.    Physical Exam Updated Vital Signs BP 128/88 (BP Location: Right Arm)   Pulse 72   Temp 98.2 F (36.8 C) (Oral)   Resp 16   SpO2 100%   Physical Exam  Constitutional: He appears well-developed and well-nourished.  HENT:  Head: Normocephalic.  Eyes: Pupils are equal, round, and reactive to light.  Neck: Normal range of motion.  Cardiovascular: Normal rate.   Pulmonary/Chest: Effort normal.  Musculoskeletal: Normal range of motion.  Neurological: He is alert.  Skin: Skin is warm and dry. No erythema.     Nursing note and vitals reviewed.    ED Treatments / Results  Labs (all labs ordered are listed, but only abnormal results are displayed) Labs Reviewed - No data to display  EKG  EKG Interpretation None       Radiology No results found.  Procedures .Foreign Body Removal Date/Time: 10/04/2016 12:55 AM Performed by: Junius Creamer Authorized by: Junius Creamer  Consent: Verbal consent obtained. Written consent not obtained. Risks and benefits: risks, benefits and alternatives were discussed Consent given by: patient Patient understanding: patient states understanding of the procedure being performed Patient identity confirmed: verbally with patient Time out: Immediately prior to procedure a "time out" was called to verify the correct patient, procedure, equipment, support staff and site/side marked as required. Body area: skin  Sedation: Patient  sedated: no Patient restrained: no Patient cooperative: yes Complexity: simple 1 objects recovered. Objects recovered: tick Post-procedure assessment: foreign body removed Patient tolerance: Patient tolerated the procedure well with no immediate complications   (including critical care time)  Medications Ordered in ED Medications - No data to display   Initial Impression / Assessment and Plan / ED Course  I have reviewed the triage vital signs and the nursing notes.  Pertinent labs & imaging results that were available during my care of the patient were reviewed by me and considered in my medical decision making (see chart for details).      Removed intact not engorged.  No surrounding erythema  Final Clinical Impressions(s) / ED Diagnoses   Final diagnoses:  Tick bite with subsequent removal of tick    New Prescriptions Discharge Medication List as of 10/04/2016 12:53 AM       Junius Creamer, NP 67/20/94 [id number]    Delora Fuel, MD 28/36/62 [phone]

## 2016-10-04 NOTE — Discharge Instructions (Signed)
The tick was removed intact.  It was not engorged.  There was no surrounding redness to the area.

## 2017-01-13 ENCOUNTER — Emergency Department (HOSPITAL_BASED_OUTPATIENT_CLINIC_OR_DEPARTMENT_OTHER): Payer: BLUE CROSS/BLUE SHIELD

## 2017-01-13 ENCOUNTER — Encounter (HOSPITAL_BASED_OUTPATIENT_CLINIC_OR_DEPARTMENT_OTHER): Payer: Self-pay | Admitting: Adult Health

## 2017-01-13 ENCOUNTER — Emergency Department (HOSPITAL_BASED_OUTPATIENT_CLINIC_OR_DEPARTMENT_OTHER)
Admission: EM | Admit: 2017-01-13 | Discharge: 2017-01-13 | Disposition: A | Discharge status: Home / Self Care | Payer: BLUE CROSS/BLUE SHIELD | Attending: Emergency Medicine | Admitting: Emergency Medicine

## 2017-01-13 DIAGNOSIS — Z87891 Personal history of nicotine dependence: Secondary | ICD-10-CM | POA: Insufficient documentation

## 2017-01-13 DIAGNOSIS — B9789 Other viral agents as the cause of diseases classified elsewhere: Secondary | ICD-10-CM | POA: Insufficient documentation

## 2017-01-13 DIAGNOSIS — Z79899 Other long term (current) drug therapy: Secondary | ICD-10-CM | POA: Diagnosis not present

## 2017-01-13 DIAGNOSIS — Z8546 Personal history of malignant neoplasm of prostate: Secondary | ICD-10-CM | POA: Insufficient documentation

## 2017-01-13 DIAGNOSIS — J069 Acute upper respiratory infection, unspecified: Secondary | ICD-10-CM | POA: Insufficient documentation

## 2017-01-13 DIAGNOSIS — R05 Cough: Secondary | ICD-10-CM | POA: Diagnosis present

## 2017-01-13 MED ORDER — PREDNISONE 20 MG PO TABS: ORAL_TABLET | ORAL | 0 refills | Status: DC

## 2017-01-13 MED ORDER — ALBUTEROL SULFATE HFA 108 (90 BASE) MCG/ACT IN AERS: 1.0000 | INHALATION_SPRAY | Freq: Four times a day (QID) | RESPIRATORY_TRACT | 0 refills | Status: DC | PRN

## 2017-01-13 MED ORDER — PREDNISONE 50 MG PO TABS
60.0000 mg | ORAL_TABLET | Freq: Once | ORAL | Status: AC
  Administered 2017-01-13: 60 mg via ORAL
  Filled 2017-01-13: qty 1

## 2017-01-13 MED ORDER — BENZONATATE 100 MG PO CAPS: 100.0000 mg | ORAL_CAPSULE | Freq: Three times a day (TID) | ORAL | 0 refills | Status: DC

## 2017-01-13 MED ORDER — IBUPROFEN 400 MG PO TABS: 400.0000 mg | ORAL_TABLET | Freq: Four times a day (QID) | ORAL | 0 refills | Status: DC | PRN

## 2017-01-13 MED ORDER — BENZONATATE 100 MG PO CAPS
200.0000 mg | ORAL_CAPSULE | Freq: Once | ORAL | Status: AC
  Administered 2017-01-13: 200 mg via ORAL
  Filled 2017-01-13: qty 2

## 2017-01-13 MED ORDER — ALBUTEROL SULFATE HFA 108 (90 BASE) MCG/ACT IN AERS
2.0000 | INHALATION_SPRAY | Freq: Once | RESPIRATORY_TRACT | Status: AC
  Administered 2017-01-13: 2 via RESPIRATORY_TRACT
  Filled 2017-01-13: qty 6.7

## 2017-01-13 NOTE — ED Triage Notes (Signed)
Presents with chest tightness and cough that began three days ago. Pain is worse with movement and better with rest. Non  Productive dry hacking cough noted. denies fevers.

## 2017-01-13 NOTE — ED Provider Notes (Signed)
[facility] Provider Note   CSN: [id number] Arrival date & time: 01/13/17  0023     History   Chief Complaint Chief Complaint  Patient presents with  . Cough    HPI Mitchell Adams is a 56 y.o. male.  The history is provided by the patient. No language interpreter was used.  Cough  This is a new problem. The current episode started 2 days ago. The problem occurs constantly. The problem has not changed since onset.The cough is non-productive. There has been no fever. Pertinent negatives include no chest pain, no chills, no sweats, no weight loss, no ear congestion, no ear pain, no headaches, no rhinorrhea, no sore throat, no myalgias, no shortness of breath, no wheezing and no eye redness. Treatments tried: tylenol cold and flu. The treatment provided no relief. His past medical history does not include emphysema.    Past Medical History:  Diagnosis Date  . Cancer [facility]    Stage 4 prostate  . GERD (gastroesophageal reflux disease)    diagnosed with - +yes- GERD, occas. use of chewable antacid   . Hernia, umbilical     Patient Active Problem List   Diagnosis Date Noted  . Incisional hernia 04/29/2015    Past Surgical History:  Procedure Laterality Date  . ABDOMINAL EXPLORATION SURGERY  04/29/2015   w/LOA  . ABDOMINAL EXPLORATION SURGERY  2014   S/P colonoscopy w/multiple polyps removed/notes 04/29/2015  . ABDOMINAL SURGERY  2014   due to puncture of bowel after removal of polyps   . APPENDECTOMY  ~ 1987  . COLON SURGERY    . COLONOSCOPY W/ POLYPECTOMY  2014  . HERNIA REPAIR    . INCISIONAL HERNIA REPAIR  04/29/2015  . INCISIONAL HERNIA REPAIR N/A 04/29/2015   Procedure: OPEN INCISIONAL HERNIA REPAIR WITH MESH WITH MYOFACIAL RELEASE;  Surgeon: Rolm Bookbinder, MD;  Location: [facility];  Service: General;  Laterality: N/A;  . KNEE ARTHROSCOPY Right 02/09/2016   Procedure: ARTHROSCOPY KNEE;  Surgeon: Frederik Pear, MD;  Location: [facility];  Service:  Orthopedics;  Laterality: Right;  ARTHROSCOPY KNEE, medial meniscal tear, chondromalacia       Home Medications    Prior to Admission medications   Medication Sig Start Date End Date Taking? Authorizing Provider  albuterol (PROVENTIL HFA;VENTOLIN HFA) 108 (90 Base) MCG/ACT inhaler Inhale 1-2 puffs into the lungs every 6 (six) hours as needed for wheezing or shortness of breath. 01/13/17   Tania Steinhauser, MD  benzonatate (TESSALON) 100 MG capsule Take 1 capsule (100 mg total) by mouth every 8 (eight) hours. 01/13/17   Romy Ipock, MD  clindamycin (CLEOCIN) 150 MG capsule Take 1 capsule (150 mg total) by mouth every 6 (six) hours. 05/01/16   Davonna Belling, MD  ibuprofen (ADVIL,MOTRIN) 400 MG tablet Take 1 tablet (400 mg total) by mouth every 6 (six) hours as needed. 01/13/17   Aldahir Litaker, MD  predniSONE (DELTASONE) 20 MG tablet 3 tabs po day one, then 2 po daily x 4 days 01/13/17   Randal Buba, Dyana Magner, MD    Family History Family History  Problem Relation Age of Onset  . Diabetes Mother   . Heart disease Sister   . Diabetes Maternal Grandfather     Social History Social History  Substance Use Topics  . Smoking status: Former Smoker    Packs/day: 0.05    Years: 43.00    Types: Cigarettes    Quit date: 06/22/2014  . Smokeless tobacco: Never Used  . Alcohol  use No     Allergies   Penicillins   Review of Systems Review of Systems  Constitutional: Negative for chills and weight loss.  HENT: Negative for ear pain, rhinorrhea and sore throat.   Eyes: Negative for redness.  Respiratory: Positive for cough. Negative for shortness of breath and wheezing.   Cardiovascular: Negative for chest pain, palpitations and leg swelling.  Musculoskeletal: Negative for myalgias.  Neurological: Negative for headaches.  All other systems reviewed and are negative.    Physical Exam Updated Vital Signs BP 127/78   Pulse 80   Temp 98.5 F (36.9 C) (Oral)   Resp 18   SpO2 97%    Physical Exam  Constitutional: He appears well-developed and well-nourished. No distress.  HENT:  Head: Normocephalic and atraumatic.  Nose: Nose normal.  Mouth/Throat: No oropharyngeal exudate.  Eyes: Pupils are equal, round, and reactive to light. Conjunctivae are normal.  Neck: Normal range of motion. Neck supple. No JVD present.  Cardiovascular: Normal rate, regular rhythm, normal heart sounds and intact distal pulses.   Pulmonary/Chest: No respiratory distress. He has decreased breath sounds. He has no wheezes. He has no rales.  Abdominal: Soft. Bowel sounds are normal. He exhibits no mass. There is no tenderness. There is no rebound and no guarding.  Musculoskeletal: Normal range of motion. He exhibits no tenderness.  Neurological: He displays normal reflexes.  Skin: Skin is warm and dry. Capillary refill takes less than 2 seconds.  Psychiatric: He has a normal mood and affect.     ED Treatments / Results   Vitals:   01/13/17 0301 01/13/17 0356  BP:  127/78  Pulse:  80  Resp:  18  Temp:    SpO2: 97% 97%    Labs (all labs ordered are listed, but only abnormal results are displayed) Labs Reviewed - No data to display  EKG  EKG Interpretation  Date/Time:  Saturday January 13 2017 00:41:31 EDT Ventricular Rate:  72 PR Interval:  150 QRS Duration: 92 QT Interval:  380 QTC Calculation: 416 R Axis:   123 Text Interpretation:  Unusual P axis, possible ectopic atrial rhythm with undetermined rhythm irregularity Left posterior fascicular block Nonspecific T wave abnormality Confirmed by Randal Buba, Charnel Giles ([id number]) on 01/13/2017 12:45:22 AM       Radiology Dg Chest 2 View  Result Date: 01/13/2017 CLINICAL DATA:  Cough and chest tightness for few days. History of prostate cancer. EXAM: CHEST  2 VIEW COMPARISON:  Chest radiograph November 15, 2015 FINDINGS: Cardiomediastinal silhouette is normal. No pleural effusions or focal consolidations. Trachea projects midline and there  is no pneumothorax. Soft tissue planes and included osseous structures are non-suspicious. IMPRESSION: Stable negative chest. Electronically Signed   By: Elon Alas M.D.   On: 01/13/2017 01:12    Procedures Procedures (including critical care time)  Medications Ordered in ED Medications  predniSONE (DELTASONE) tablet 60 mg (60 mg Oral Given 01/13/17 0302)  albuterol (PROVENTIL HFA;VENTOLIN HFA) 108 (90 Base) MCG/ACT inhaler 2 puff (2 puffs Inhalation Given 01/13/17 0258)  benzonatate (TESSALON) capsule 200 mg (200 mg Oral Given 01/13/17 0301)      Final Clinical Impressions(s) / ED Diagnoses   Final diagnoses:  Viral URI with cough   Strict return precautions given for  Shortness of breath, swelling or the lips or tongue, chest pain, dyspnea on exertion, new weakness or numbness changes in vision or speech,  Inability to tolerate liquids or food, changes in voice cough, altered mental status or any concerns.  No signs of systemic illness or infection. The patient is nontoxic-appearing on exam and vital signs are within normal limits.    I have reviewed the triage vital signs and the nursing notes. Pertinent labs &imaging results that were available during my care of the patient were reviewed by me and considered in my medical decision making (see chart for details).  After history, exam, and medical workup I feel the patient has been appropriately medically screened and is safe for discharge home. Pertinent diagnoses were discussed with the patient. Patient was given return precautions.   New Prescriptions Discharge Medication List as of 01/13/2017  3:42 AM    START taking these medications   Details  albuterol (PROVENTIL HFA;VENTOLIN HFA) 108 (90 Base) MCG/ACT inhaler Inhale 1-2 puffs into the lungs every 6 (six) hours as needed for wheezing or shortness of breath., Starting Sat 01/13/2017, Print    benzonatate (TESSALON) 100 MG capsule Take 1 capsule (100 mg total) by mouth  every 8 (eight) hours., Starting Sat 01/13/2017, Print    ibuprofen (ADVIL,MOTRIN) 400 MG tablet Take 1 tablet (400 mg total) by mouth every 6 (six) hours as needed., Starting Sat 01/13/2017, Print    predniSONE (DELTASONE) 20 MG tablet 3 tabs po day one, then 2 po daily x 4 days, Print         Trish Mancinelli, MD 01/13/17 6761

## 2017-01-13 NOTE — ED Notes (Signed)
ED Provider at bedside. 

## 2017-03-05 ENCOUNTER — Encounter (HOSPITAL_COMMUNITY): Payer: Self-pay | Admitting: Emergency Medicine

## 2017-03-05 ENCOUNTER — Emergency Department (HOSPITAL_COMMUNITY): Payer: BLUE CROSS/BLUE SHIELD

## 2017-03-05 ENCOUNTER — Emergency Department (HOSPITAL_COMMUNITY)
Admission: EM | Admit: 2017-03-05 | Discharge: 2017-03-05 | Disposition: A | Discharge status: Home / Self Care | Payer: BLUE CROSS/BLUE SHIELD | Attending: Emergency Medicine | Admitting: Emergency Medicine

## 2017-03-05 ENCOUNTER — Other Ambulatory Visit: Payer: Self-pay

## 2017-03-05 DIAGNOSIS — J069 Acute upper respiratory infection, unspecified: Secondary | ICD-10-CM | POA: Insufficient documentation

## 2017-03-05 DIAGNOSIS — M791 Myalgia, unspecified site: Secondary | ICD-10-CM | POA: Insufficient documentation

## 2017-03-05 DIAGNOSIS — Z87891 Personal history of nicotine dependence: Secondary | ICD-10-CM | POA: Insufficient documentation

## 2017-03-05 DIAGNOSIS — R509 Fever, unspecified: Secondary | ICD-10-CM | POA: Insufficient documentation

## 2017-03-05 DIAGNOSIS — R05 Cough: Secondary | ICD-10-CM | POA: Diagnosis present

## 2017-03-05 LAB — CBC WITH DIFFERENTIAL/PLATELET
BASOS ABS: 0 10*3/uL (ref 0.0–0.1)
BASOS PCT: 1 %
EOS ABS: 0.1 10*3/uL (ref 0.0–0.7)
Eosinophils Relative: 2 %
HCT: 35.9 % — ABNORMAL LOW (ref 39.0–52.0)
Hemoglobin: 12 g/dL — ABNORMAL LOW (ref 13.0–17.0)
Lymphocytes Relative: 26 %
Lymphs Abs: 1 10*3/uL (ref 0.7–4.0)
MCH: 31.1 pg (ref 26.0–34.0)
MCHC: 33.4 g/dL (ref 30.0–36.0)
MCV: 93 fL (ref 78.0–100.0)
MONO ABS: 0.2 10*3/uL (ref 0.1–1.0)
MONOS PCT: 6 %
NEUTROS PCT: 65 %
Neutro Abs: 2.5 10*3/uL (ref 1.7–7.7)
PLATELETS: 220 10*3/uL (ref 150–400)
RBC: 3.86 MIL/uL — ABNORMAL LOW (ref 4.22–5.81)
RDW: 12.3 % (ref 11.5–15.5)
WBC: 3.8 10*3/uL — ABNORMAL LOW (ref 4.0–10.5)

## 2017-03-05 LAB — INFLUENZA PANEL BY PCR (TYPE A & B)
Influenza A By PCR: NEGATIVE
Influenza B By PCR: NEGATIVE

## 2017-03-05 LAB — COMPREHENSIVE METABOLIC PANEL
ALBUMIN: 3.7 g/dL (ref 3.5–5.0)
ALT: 20 U/L (ref 17–63)
ANION GAP: 6 (ref 5–15)
AST: 19 U/L (ref 15–41)
Alkaline Phosphatase: 63 U/L (ref 38–126)
BUN: 12 mg/dL (ref 6–20)
CHLORIDE: 104 mmol/L (ref 101–111)
CO2: 27 mmol/L (ref 22–32)
Calcium: 9 mg/dL (ref 8.9–10.3)
Creatinine, Ser: 0.85 mg/dL (ref 0.61–1.24)
GFR calc Af Amer: >60 mL/min (ref >60)
GFR calc non Af Amer: >60 mL/min (ref >60)
GLUCOSE: 115 mg/dL — AB (ref 65–99)
Potassium: 4.3 mmol/L (ref 3.5–5.1)
SODIUM: 137 mmol/L (ref 135–145)
TOTAL PROTEIN: 6.4 g/dL — AB (ref 6.5–8.1)
Total Bilirubin: 0.7 mg/dL (ref 0.3–1.2)

## 2017-03-05 MED ORDER — ALBUTEROL SULFATE HFA 108 (90 BASE) MCG/ACT IN AERS: 2.0000 | INHALATION_SPRAY | RESPIRATORY_TRACT | 3 refills | Status: DC | PRN

## 2017-03-05 MED ORDER — BENZONATATE 100 MG PO CAPS: 100.0000 mg | ORAL_CAPSULE | Freq: Three times a day (TID) | ORAL | 0 refills | Status: DC

## 2017-03-05 MED ORDER — IPRATROPIUM-ALBUTEROL 0.5-2.5 (3) MG/3ML IN SOLN
3.0000 mL | Freq: Once | RESPIRATORY_TRACT | Status: AC
  Administered 2017-03-05: 3 mL via RESPIRATORY_TRACT
  Filled 2017-03-05: qty 3

## 2017-03-05 MED ORDER — BENZONATATE 100 MG PO CAPS
200.0000 mg | ORAL_CAPSULE | Freq: Once | ORAL | Status: AC
  Administered 2017-03-05: 200 mg via ORAL
  Filled 2017-03-05: qty 2

## 2017-03-05 NOTE — ED Provider Notes (Signed)
Mitchell Adams Provider Note   CSN: [id number] Arrival date & time: 03/05/17  0740     History   Chief Complaint Chief Complaint  Patient presents with  . Generalized Body Aches  . Cough    HPI Mitchell Adams is a 56 y.o. male.  HPI  The pt is a 56 y/o male - has hx of GERD and Stage 4 Prostate Ca - has gone through the treatment with both radiation and chemotherapy.  He reports that his last dose was approximately 1 month ago, he was in his usual state of health until yesterday when he started to develop chills, subjective fevers and a dry cough, he does have some body aches with this as well.  He did have some nausea and vomiting overnight.  He denies dysuria, diarrhea, swelling, rashes, headache, blurred vision.  He has no sore throat or nasal congestion.  He does not ever have any respiratory symptoms unless he is sick.  He has taken no medication prior to arrival.  The symptoms have been persistent over the last 18 hours  Past Medical History:  Diagnosis Date  . Cancer [facility])    Stage 4 prostate  . GERD (gastroesophageal reflux disease)    diagnosed with - +yes- GERD, occas. use of chewable antacid   . Hernia, umbilical     Patient Active Problem List   Diagnosis Date Noted  . Incisional hernia 04/29/2015    Past Surgical History:  Procedure Laterality Date  . ABDOMINAL EXPLORATION SURGERY  04/29/2015   w/LOA  . ABDOMINAL EXPLORATION SURGERY  2014   S/P colonoscopy w/multiple polyps removed/notes 04/29/2015  . ABDOMINAL SURGERY  2014   due to puncture of bowel after removal of polyps   . APPENDECTOMY  ~ 1987  . COLON SURGERY    . COLONOSCOPY W/ POLYPECTOMY  2014  . HERNIA REPAIR    . [facility]  04/29/2015       Home Medications    Prior to Admission medications   Medication Sig Start Date End Date Taking? Authorizing Provider  albuterol (PROVENTIL HFA;VENTOLIN HFA) 108 (90 Base) MCG/ACT inhaler Inhale 2  puffs every 4 (four) hours as needed into the lungs for wheezing or shortness of breath. 03/05/17   Noemi Chapel, MD  benzonatate (TESSALON) 100 MG capsule Take 1 capsule (100 mg total) every 8 (eight) hours by mouth. 03/05/17   Noemi Chapel, MD  clindamycin (CLEOCIN) 150 MG capsule Take 1 capsule (150 mg total) by mouth every 6 (six) hours. Patient not taking: Reported on 03/05/2017 05/01/16   Davonna Belling, MD  ibuprofen (ADVIL,MOTRIN) 400 MG tablet Take 1 tablet (400 mg total) by mouth every 6 (six) hours as needed. Patient not taking: Reported on 03/05/2017 01/13/17   Palumbo, April, MD  predniSONE (DELTASONE) 20 MG tablet 3 tabs po day one, then 2 po daily x 4 days Patient not taking: Reported on 03/05/2017 01/13/17   Randal Buba, April, MD    Family History Family History  Problem Relation Age of Onset  . Diabetes Mother   . Heart disease Sister   . Diabetes Maternal Grandfather     Social History Social History   Tobacco Use  . Smoking status: Former Smoker    Packs/day: 0.05    Years: 43.00    Pack years: 2.15    Types: Cigarettes    Last attempt to quit: 06/22/2014    Years since quitting: 2.7  . Smokeless tobacco: Never Used  Substance Use Topics  . Alcohol use: No  . Drug use: No     Allergies   Penicillins   Review of Systems Review of Systems  All other systems reviewed and are negative.    Physical Exam Updated Vital Signs BP 106/75   Pulse 66   Temp 98.1 F (36.7 C) (Oral)   Resp 17   SpO2 97%   Physical Exam  Constitutional: He appears well-developed and well-nourished. No distress.  HENT:  Head: Normocephalic and atraumatic.  Mouth/Throat: Oropharynx is clear and moist. No oropharyngeal exudate.  Eyes: Conjunctivae and EOM are normal. Pupils are equal, round, and reactive to light. Right eye exhibits no discharge. Left eye exhibits no discharge. No scleral icterus.  Neck: Normal range of motion. Neck supple. No JVD present. No thyromegaly  present.  Cardiovascular: Normal rate, regular rhythm, normal heart sounds and intact distal pulses. Exam reveals no gallop and no friction rub.  No murmur heard. Pulmonary/Chest: Effort normal and breath sounds normal. No respiratory distress. He has no wheezes. He has no rales.  Abdominal: Soft. Bowel sounds are normal. He exhibits no distension and no mass. There is no tenderness.  Musculoskeletal: Normal range of motion. He exhibits no edema or tenderness.  Lymphadenopathy:    He has no cervical adenopathy.  Neurological: He is alert. Coordination normal.  Skin: Skin is warm and dry. No rash noted. No erythema.  Psychiatric: He has a normal mood and affect. His behavior is normal.  Nursing note and vitals reviewed.    ED Treatments / Results  Labs (all labs ordered are listed, but only abnormal results are displayed) Labs Reviewed  CBC WITH DIFFERENTIAL/PLATELET - Abnormal; Notable for the following components:      Result Value   WBC 3.8 (*)    RBC 3.86 (*)    Hemoglobin 12.0 (*)    HCT 35.9 (*)    All other components within normal limits  COMPREHENSIVE METABOLIC PANEL - Abnormal; Notable for the following components:   Glucose, Bld 115 (*)    Total Protein 6.4 (*)    All other components within normal limits  INFLUENZA PANEL BY PCR (TYPE A & B)    Radiology Dg Chest 2 View  Result Date: 03/05/2017 CLINICAL DATA:  Coughing and throwing up.  Prostate cancer. EXAM: CHEST  2 VIEW COMPARISON:  01/13/2017 FINDINGS: The heart size and mediastinal contours are within normal limits. Both lungs are clear. The visualized skeletal structures are unremarkable. No pleural effusions. IMPRESSION: No active cardiopulmonary disease. Electronically Signed   By: Markus Daft M.D.   On: 03/05/2017 08:45    Procedures Procedures (including critical care time)  Medications Ordered in ED Medications  benzonatate (TESSALON) capsule 200 mg (200 mg Oral Given 03/05/17 0933)    ipratropium-albuterol (DUONEB) 0.5-2.5 (3) MG/3ML nebulizer solution 3 mL (3 mLs Nebulization Given 03/05/17 0901)     Initial Impression / Assessment and Plan / ED Course  I have reviewed the triage vital signs and the nursing notes.  Pertinent labs & imaging results that were available during my care of the patient were reviewed by me and considered in my medical decision making (see chart for details).     The patient's exam is unremarkable including vital signs as below, heart rate, lung sounds, there is no edema abdominal tenderness or abnormal findings in the pharynx.  The patient is concerned given his recent treatment for prostate cancer however he is not technically immunocompromised or at least should  not be.  We will check labs including an influenza panel and a chest x-ray and given albuterol treatment with Tessalon to help with some of the coughing.  Labs flu and xray neg Pt stable for d/c.  Final Clinical Impressions(s) / ED Diagnoses   Final diagnoses:  Upper respiratory tract infection, unspecified type    ED Discharge Orders        Ordered    benzonatate (TESSALON) 100 MG capsule  Every 8 hours     03/05/17 1407    albuterol (PROVENTIL HFA;VENTOLIN HFA) 108 (90 Base) MCG/ACT inhaler  Every 4 hours PRN     03/05/17 1407       Noemi Chapel, MD 03/05/17 1408

## 2017-03-05 NOTE — Discharge Instructions (Signed)
Your xray showed no pneumonia and your flu test was normal - your blood work was reassuring You likely have a virus You are contagious Take Tessalon every 8 hours as needed for coughing Albuterol every 4 hours as needed for wheezing ER for worsening symptoms.

## 2017-03-05 NOTE — ED Notes (Signed)
Patient verbalizes understanding of discharge instructions. Opportunity for questioning and answers were provided. 

## 2017-03-05 NOTE — ED Triage Notes (Signed)
Pt to ER for evaluation of 2 days of non-productive cough with body aches and chills. Pt in NAD. States is "stage IV prostate cancer patient in remission, finished treatments in May." VSS

## 2017-03-26 ENCOUNTER — Other Ambulatory Visit: Payer: Self-pay | Admitting: General Surgery

## 2017-03-26 DIAGNOSIS — R109 Unspecified abdominal pain: Secondary | ICD-10-CM

## 2017-03-30 ENCOUNTER — Ambulatory Visit
Admission: RE | Admit: 2017-03-30 | Discharge: 2017-03-30 | Disposition: A | Discharge status: Home / Self Care | Payer: BLUE CROSS/BLUE SHIELD | Source: Ambulatory Visit | Attending: General Surgery | Admitting: General Surgery

## 2017-03-30 DIAGNOSIS — R109 Unspecified abdominal pain: Secondary | ICD-10-CM

## 2017-03-30 MED ORDER — IOPAMIDOL (ISOVUE-300) INJECTION 61%
100.0000 mL | Freq: Once | INTRAVENOUS | Status: AC | PRN
  Administered 2017-03-30: 100 mL via INTRAVENOUS

## 2017-05-22 ENCOUNTER — Other Ambulatory Visit: Payer: Self-pay

## 2017-05-22 ENCOUNTER — Encounter (HOSPITAL_BASED_OUTPATIENT_CLINIC_OR_DEPARTMENT_OTHER): Payer: Self-pay | Admitting: *Deleted

## 2017-05-22 ENCOUNTER — Emergency Department (HOSPITAL_BASED_OUTPATIENT_CLINIC_OR_DEPARTMENT_OTHER)
Admission: EM | Admit: 2017-05-22 | Discharge: 2017-05-22 | Disposition: A | Discharge status: Home / Self Care | Payer: BLUE CROSS/BLUE SHIELD | Attending: Emergency Medicine | Admitting: Emergency Medicine

## 2017-05-22 DIAGNOSIS — Z79899 Other long term (current) drug therapy: Secondary | ICD-10-CM | POA: Diagnosis not present

## 2017-05-22 DIAGNOSIS — R110 Nausea: Secondary | ICD-10-CM | POA: Insufficient documentation

## 2017-05-22 DIAGNOSIS — R42 Dizziness and giddiness: Secondary | ICD-10-CM | POA: Insufficient documentation

## 2017-05-22 DIAGNOSIS — Z87891 Personal history of nicotine dependence: Secondary | ICD-10-CM | POA: Diagnosis not present

## 2017-05-22 DIAGNOSIS — Z8546 Personal history of malignant neoplasm of prostate: Secondary | ICD-10-CM | POA: Insufficient documentation

## 2017-05-22 HISTORY — DX: Dizziness and giddiness: R42

## 2017-05-22 LAB — BASIC METABOLIC PANEL
Anion gap: 10 (ref 5–15)
BUN: 16 mg/dL (ref 6–20)
CALCIUM: 9.2 mg/dL (ref 8.9–10.3)
CHLORIDE: 105 mmol/L (ref 101–111)
CO2: 23 mmol/L (ref 22–32)
CREATININE: 0.77 mg/dL (ref 0.61–1.24)
GFR calc Af Amer: >60 mL/min (ref >60)
GFR calc non Af Amer: >60 mL/min (ref >60)
GLUCOSE: 120 mg/dL — AB (ref 65–99)
Potassium: 3.8 mmol/L (ref 3.5–5.1)
Sodium: 138 mmol/L (ref 135–145)

## 2017-05-22 LAB — CBC WITH DIFFERENTIAL/PLATELET
BASOS PCT: 1 %
Basophils Absolute: 0 10*3/uL (ref 0.0–0.1)
EOS ABS: 0.1 10*3/uL (ref 0.0–0.7)
Eosinophils Relative: 3 %
HEMATOCRIT: 34 % — AB (ref 39.0–52.0)
HEMOGLOBIN: 11.6 g/dL — AB (ref 13.0–17.0)
LYMPHS ABS: 0.8 10*3/uL (ref 0.7–4.0)
Lymphocytes Relative: 22 %
MCH: 31.5 pg (ref 26.0–34.0)
MCHC: 34.1 g/dL (ref 30.0–36.0)
MCV: 92.4 fL (ref 78.0–100.0)
MONO ABS: 0.4 10*3/uL (ref 0.1–1.0)
MONOS PCT: 10 %
Neutro Abs: 2.5 10*3/uL (ref 1.7–7.7)
Neutrophils Relative %: 64 %
Platelets: 232 10*3/uL (ref 150–400)
RBC: 3.68 MIL/uL — ABNORMAL LOW (ref 4.22–5.81)
RDW: 11.8 % (ref 11.5–15.5)
WBC: 3.8 10*3/uL — ABNORMAL LOW (ref 4.0–10.5)

## 2017-05-22 MED ORDER — MECLIZINE HCL 25 MG PO TABS: 25.0000 mg | ORAL_TABLET | Freq: Three times a day (TID) | ORAL | 0 refills | Status: DC | PRN

## 2017-05-22 MED ORDER — MECLIZINE HCL 25 MG PO TABS
25.0000 mg | ORAL_TABLET | Freq: Once | ORAL | Status: AC
  Administered 2017-05-22: 25 mg via ORAL
  Filled 2017-05-22: qty 1

## 2017-05-22 NOTE — ED Triage Notes (Signed)
Yesterday morning Pt began to experience nausea, dizziness and gait issues. Stated that he was treated for vertigo awhile ago.

## 2017-05-22 NOTE — ED Notes (Signed)
NAD at this time. Pt is stable and going home.  

## 2017-05-22 NOTE — ED Notes (Signed)
Ambulated ~60 feet, still complaint dizziness but better than previous. VSS updated once back in room5.

## 2017-05-22 NOTE — ED Provider Notes (Signed)
[facility] EMERGENCY DEPARTMENT Provider Note   CSN: [id number] Arrival date & time: 05/22/17  6222     History   Chief Complaint Chief Complaint  Patient presents with  . Nausea  . Dizziness  . Gait Problem    HPI Skeeter Sheard is a 57 y.o. male.  Patient is a 57 year old male with a history of prostate cancer who presents with dizziness.  He states that he woke up yesterday morning with dizziness which he describes as a spinning sensation.  He first noticed it when he got out of bed yesterday morning.  He has had trouble with his balance since that time.  He states the dizziness is worse when he turns his head from side to side or bends over.  He denies any associated headache.  He had a headache that was mild a couple days ago but no headaches since that time.  No recent head trauma.  No numbness or weakness to his extremities.  No speech deficits or vision changes.  He has had some nausea but no vomiting.  He states he had a history of vertigo in the past which felt similar to this episode.  He has not taken any medications for the symptoms.  He denies any recent URI symptoms.  No fevers or other recent illnesses.      Past Medical History:  Diagnosis Date  . Cancer [facility])    Stage 4 prostate  . GERD (gastroesophageal reflux disease)    diagnosed with - +yes- GERD, occas. use of chewable antacid   . Hernia, umbilical   . Vertigo     Patient Active Problem List   Diagnosis Date Noted  . Incisional hernia 04/29/2015    Past Surgical History:  Procedure Laterality Date  . ABDOMINAL EXPLORATION SURGERY  04/29/2015   w/LOA  . ABDOMINAL EXPLORATION SURGERY  2014   S/P colonoscopy w/multiple polyps removed/notes 04/29/2015  . ABDOMINAL SURGERY  2014   due to puncture of bowel after removal of polyps   . APPENDECTOMY  ~ 1987  . COLON SURGERY    . COLONOSCOPY W/ POLYPECTOMY  2014  . HERNIA REPAIR    . INCISIONAL HERNIA REPAIR  04/29/2015  . INCISIONAL HERNIA  REPAIR N/A 04/29/2015   Procedure: OPEN INCISIONAL HERNIA REPAIR WITH MESH WITH MYOFACIAL RELEASE;  Surgeon: Rolm Bookbinder, MD;  Location: [place];  Service: General;  Laterality: N/A;  . KNEE ARTHROSCOPY Right 02/09/2016   Procedure: ARTHROSCOPY KNEE;  Surgeon: Frederik Pear, MD;  Location: [place];  Service: Orthopedics;  Laterality: Right;  ARTHROSCOPY KNEE, medial meniscal tear, chondromalacia       Home Medications    Prior to Admission medications   Medication Sig Start Date End Date Taking? Authorizing Provider  dicyclomine (BENTYL) 10 MG capsule Take 10 mg by mouth 4 (four) times daily -  before meals and at bedtime.   Yes [provider]  albuterol (PROVENTIL HFA;VENTOLIN HFA) 108 (90 Base) MCG/ACT inhaler Inhale 2 puffs every 4 (four) hours as needed into the lungs for wheezing or shortness of breath. 03/05/17   Noemi Chapel, MD  benzonatate (TESSALON) 100 MG capsule Take 1 capsule (100 mg total) every 8 (eight) hours by mouth. 03/05/17   Noemi Chapel, MD  clindamycin (CLEOCIN) 150 MG capsule Take 1 capsule (150 mg total) by mouth every 6 (six) hours. Patient not taking: Reported on 03/05/2017 05/01/16   Davonna Belling, MD  ibuprofen (ADVIL,MOTRIN) 400 MG tablet Take 1 tablet (400  mg total) by mouth every 6 (six) hours as needed. Patient not taking: Reported on 03/05/2017 01/13/17   Randal Buba, April, MD  meclizine (ANTIVERT) 25 MG tablet Take 1 tablet (25 mg total) by mouth 3 (three) times daily as needed for dizziness. 05/22/17   Malvin Johns, MD  predniSONE (DELTASONE) 20 MG tablet 3 tabs po day one, then 2 po daily x 4 days Patient not taking: Reported on 03/05/2017 01/13/17   Randal Buba, April, MD    Family History Family History  Problem Relation Age of Onset  . Diabetes Mother   . Heart disease Sister   . Diabetes Maternal Grandfather     Social History Social History   Tobacco Use  . Smoking status: Former Smoker    Packs/day: 0.05     Years: 43.00    Pack years: 2.15    Types: Cigarettes    Last attempt to quit: 06/22/2014    Years since quitting: 2.9  . Smokeless tobacco: Never Used  Substance Use Topics  . Alcohol use: No  . Drug use: No     Allergies   Penicillins   Review of Systems Review of Systems  Constitutional: Negative for chills, diaphoresis, fatigue and fever.  HENT: Negative for congestion, rhinorrhea and sneezing.   Eyes: Negative.   Respiratory: Negative for cough, chest tightness and shortness of breath.   Cardiovascular: Negative for chest pain and leg swelling.  Gastrointestinal: Positive for nausea. Negative for abdominal pain, blood in stool, diarrhea and vomiting.  Genitourinary: Negative for difficulty urinating, flank pain, frequency and hematuria.  Musculoskeletal: Negative for arthralgias and back pain.  Skin: Negative for rash.  Neurological: Positive for dizziness. Negative for speech difficulty, weakness, numbness and headaches.     Physical Exam Updated Vital Signs BP 118/84   Pulse 87   Temp 98 F (36.7 C) (Oral)   Resp (!) 21   Ht 5' 7.5" (1.715 m)   Wt 83.9 kg (185 lb)   SpO2 99%   BMI 28.55 kg/m   Physical Exam  Constitutional: He is oriented to person, place, and time. He appears well-developed and well-nourished.  HENT:  Head: Normocephalic and atraumatic.  Eyes: Pupils are equal, round, and reactive to light.  Positive horizontal nystagmus with a fast component to the left.  No vertical or rotational nystagmus.  Neck: Normal range of motion. Neck supple.  No neck stiffness  Cardiovascular: Normal rate, regular rhythm and normal heart sounds.  Pulmonary/Chest: Effort normal and breath sounds normal. No respiratory distress. He has no wheezes. He has no rales. He exhibits no tenderness.  Abdominal: Soft. Bowel sounds are normal. There is no tenderness. There is no rebound and no guarding.  Musculoskeletal: Normal range of motion. He exhibits no edema.    Lymphadenopathy:    He has no cervical adenopathy.  Neurological: He is alert and oriented to person, place, and time.  Motor 5/5 all extremities Sensation grossly intact to LT all extremities Finger to Nose intact, no pronator drift CN II-XII grossly intact Gait normal   Skin: Skin is warm and dry. No rash noted.  Psychiatric: He has a normal mood and affect.     ED Treatments / Results  Labs (all labs ordered are listed, but only abnormal results are displayed) Labs Reviewed  BASIC METABOLIC PANEL - Abnormal; Notable for the following components:      Result Value   Glucose, Bld 120 (*)    All other components within normal limits  CBC WITH  DIFFERENTIAL/PLATELET - Abnormal; Notable for the following components:   WBC 3.8 (*)    RBC 3.68 (*)    Hemoglobin 11.6 (*)    HCT 34.0 (*)    All other components within normal limits    EKG  EKG Interpretation  Date/Time:  Tuesday May 22 2017 07:55:11 EST Ventricular Rate:  66 PR Interval:    QRS Duration: 101 QT Interval:  397 QTC Calculation: 416 R Axis:   62 Text Interpretation:  Sinus rhythm RSR' in V1 or V2, right VCD or RVH since last tracing no significant change Confirmed by Malvin Johns [phone]) on 05/22/2017 7:58:20 AM       Radiology No results found.  Procedures Procedures (including critical care time)  Medications Ordered in ED Medications  meclizine (ANTIVERT) tablet 25 mg (25 mg Oral Given 05/22/17 8453)     Initial Impression / Assessment and Plan / ED Course  I have reviewed the triage vital signs and the nursing notes.  Pertinent labs & imaging results that were available during my care of the patient were reviewed by me and considered in my medical decision making (see chart for details).    Patient is a 57 year old male who presents with vertigo.  He has had similar symptoms in the past.  He was given meclizine here in the emergency department and has improvement in symptoms.  He still  has some slight dizziness but feels markedly better.  He is able to ambulate unassisted without ataxia.  He does not have any suggestions of a central etiology for the vertigo.  He was discharged home in good condition.  He was given a prescription for meclizine.  He was encouraged to have close follow-up with his PCP.  His labs are reviewed and similar to baseline values.  Final Clinical Impressions(s) / ED Diagnoses   Final diagnoses:  Vertigo    ED Discharge Orders        Ordered    meclizine (ANTIVERT) 25 MG tablet  3 times daily PRN     05/22/17 0950       Malvin Johns, MD 05/22/17 [phone]

## 2017-08-22 IMAGING — CT CT ABD-PELV W/ CM
1 series · 1 of 2 positions shown · IV contrast (omnipaque)
Comparison: 10/27/2013

CLINICAL DATA: Abdominal pain right above the belly button, known
hernia

EXAM:
CT ABDOMEN AND PELVIS WITH CONTRAST
TECHNIQUE: Multidetector CT imaging of the abdomen and pelvis was performed
using the standard protocol following bolus administration of
intravenous contrast.
CONTRAST:  100mL OMNIPAQUE IOHEXOL 300 MG/ML  SOLN

[Series 100: scout · coronal · 0.6mm · 0.98mm/px · 1 of 2 slices shown]
[im 2/2]
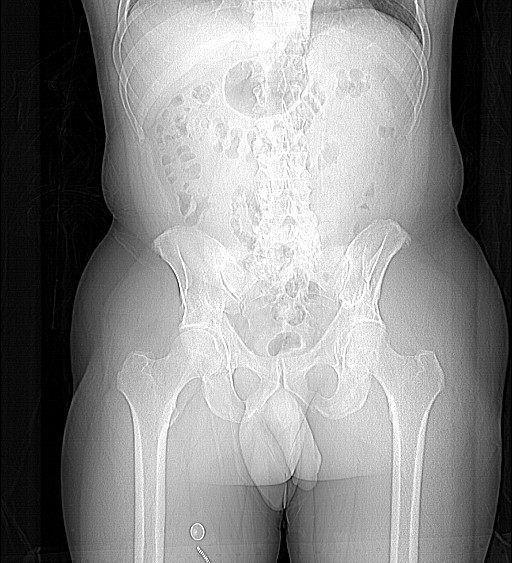

[1 of 2 positions shown; findings below may reference images not displayed]

FINDINGS: Lower chest:  Normal

Hepatobiliary: Normal

Pancreas: Normal

Spleen: Normal

Adrenals/Urinary Tract: 4 cm left renal cyst stable. Otherwise
normal.

Stomach/Bowel: Evidence of prior anastomosis involving the colon
near the hepatic flexure. Nonobstructive bowel gas pattern. Bowel
appears otherwise normal.

Vascular/Lymphatic: Normal

Reproductive: Normal

Other: No ascites. There is a midline periumbilical hernia. The
hernia sac measures 7 cm in diameter. It contains only fat. It is
larger than it was previously as compared to about 1.5 cm on the
prior study. Second hernia just inferior to this is unchanged at
about 2 cm. There is mild inflammatory change in the subcutaneous
fat around the umbilicus and hernias.

Musculoskeletal: No acute osseous abnormalities.
IMPRESSION: Two adjacent abdominal wall hernias, 1 of which has enlarged when
compared to the prior study. Nonetheless these contain only fat.
There is mild nonspecific inflammatory change in the surrounding
subcutaneous fat.

## 2017-08-27 ENCOUNTER — Encounter (HOSPITAL_COMMUNITY): Payer: Self-pay

## 2017-08-27 ENCOUNTER — Other Ambulatory Visit: Payer: Self-pay

## 2017-08-27 ENCOUNTER — Emergency Department (HOSPITAL_COMMUNITY)
Admission: EM | Admit: 2017-08-27 | Discharge: 2017-08-27 | Disposition: A | Discharge status: Home / Self Care | Payer: BLUE CROSS/BLUE SHIELD | Attending: Emergency Medicine | Admitting: Emergency Medicine

## 2017-08-27 DIAGNOSIS — R109 Unspecified abdominal pain: Secondary | ICD-10-CM | POA: Diagnosis present

## 2017-08-27 DIAGNOSIS — R197 Diarrhea, unspecified: Secondary | ICD-10-CM | POA: Diagnosis not present

## 2017-08-27 DIAGNOSIS — R112 Nausea with vomiting, unspecified: Secondary | ICD-10-CM | POA: Insufficient documentation

## 2017-08-27 DIAGNOSIS — Z87891 Personal history of nicotine dependence: Secondary | ICD-10-CM | POA: Diagnosis not present

## 2017-08-27 LAB — CBC
HEMATOCRIT: 39.8 % (ref 39.0–52.0)
HEMOGLOBIN: 13.2 g/dL (ref 13.0–17.0)
MCH: 30.8 pg (ref 26.0–34.0)
MCHC: 33.2 g/dL (ref 30.0–36.0)
MCV: 92.8 fL (ref 78.0–100.0)
Platelets: 245 10*3/uL (ref 150–400)
RBC: 4.29 MIL/uL (ref 4.22–5.81)
RDW: 12.3 % (ref 11.5–15.5)
WBC: 3.9 10*3/uL — ABNORMAL LOW (ref 4.0–10.5)

## 2017-08-27 LAB — COMPREHENSIVE METABOLIC PANEL
ALT: 21 U/L (ref 17–63)
ANION GAP: 6 (ref 5–15)
AST: 17 U/L (ref 15–41)
Albumin: 3.9 g/dL (ref 3.5–5.0)
Alkaline Phosphatase: 79 U/L (ref 38–126)
BILIRUBIN TOTAL: 0.6 mg/dL (ref 0.3–1.2)
BUN: 13 mg/dL (ref 6–20)
CHLORIDE: 106 mmol/L (ref 101–111)
CO2: 24 mmol/L (ref 22–32)
Calcium: 8.8 mg/dL — ABNORMAL LOW (ref 8.9–10.3)
Creatinine, Ser: 0.8 mg/dL (ref 0.61–1.24)
GFR calc Af Amer: >60 mL/min (ref >60)
GFR calc non Af Amer: >60 mL/min (ref >60)
Glucose, Bld: 105 mg/dL — ABNORMAL HIGH (ref 65–99)
POTASSIUM: 4.1 mmol/L (ref 3.5–5.1)
SODIUM: 136 mmol/L (ref 135–145)
TOTAL PROTEIN: 6.9 g/dL (ref 6.5–8.1)

## 2017-08-27 LAB — URINALYSIS, ROUTINE W REFLEX MICROSCOPIC
BILIRUBIN URINE: NEGATIVE
Glucose, UA: NEGATIVE mg/dL
Hgb urine dipstick: NEGATIVE
Ketones, ur: NEGATIVE mg/dL
LEUKOCYTES UA: NEGATIVE
NITRITE: NEGATIVE
PH: 5 (ref 5.0–8.0)
Protein, ur: NEGATIVE mg/dL
SPECIFIC GRAVITY, URINE: 1.028 (ref 1.005–1.030)

## 2017-08-27 LAB — LIPASE, BLOOD: LIPASE: 23 U/L (ref 11–51)

## 2017-08-27 MED ORDER — ONDANSETRON 4 MG PO TBDP
4.0000 mg | ORAL_TABLET | Freq: Once | ORAL | Status: AC
  Administered 2017-08-27: 4 mg via ORAL
  Filled 2017-08-27: qty 1

## 2017-08-27 MED ORDER — ONDANSETRON 4 MG PO TBDP: 4.0000 mg | ORAL_TABLET | Freq: Three times a day (TID) | ORAL | 0 refills | Status: DC | PRN

## 2017-08-27 NOTE — ED Triage Notes (Signed)
Patient complains of abdominal cramping with vomiting and diarrhea since 0300. States that the cramping is better but states stomach just doesn't feel right, alert and oriented, NAD

## 2017-08-27 NOTE — Discharge Instructions (Addendum)
You were seen in the emergency department today for nausea, vomiting, diarrhea, and abdominal discomfort. Your lab work was all reassuring in the emergency department consistent with previous lab work you have had done.  It does not appear that you have a urinary tract infection.  Your kidney function and liver function both look well.  The lipase enzyme used to check your pancreas was normal.  You were treated in the emergency department with Zofran, and antinausea medication, with improvement.  We are sending you home with a few tablets of this for any persistent vomiting- take this once every 8 hours. We have prescribed you new medication(s) today. Discuss the medications prescribed today with your pharmacist as they can have adverse effects and interactions with your other medicines including over the counter and prescribed medications. Seek medical evaluation if you start to experience new or abnormal symptoms after taking one of these medicines, seek care immediately if you start to experience difficulty breathing, feeling of your throat closing, facial swelling, or rash as these could be indications of a more serious allergic reaction   It is important that you follow-up with your primary care provider in the next 2 to 3 days for reevaluation.  It is extremely important that she return to the ER immediately if you start to have new or worsening symptoms including but not limited to worsening abdominal pain, fever, inability to keep fluids, inability to have a bowel movement in 48 hour time period, blood in your stool, blood in your vomit, or any other concerns that you may have.

## 2017-08-27 NOTE — ED Provider Notes (Signed)
[facility] EMERGENCY DEPARTMENT Provider Note   CSN: [id number] Arrival date & time: 08/27/17  6967     History   Chief Complaint No chief complaint on file.   HPI Mitchell Adams is a 57 y.o. male with a hx of stage 4 prostate cancer in remission s/p chemo and radiation, GERD, appendectomy, colon surgery s/p perforation related to polyp removal, and incisional hernia repair who presents to the ED with complaints of abdominal cramping with associated N/V/D that started last night which is improved at present. Patient states that last night around 2100 he developed crampy type abdominal pain. He was able to go to sleep. He subsequently woke up between 0200 and 0300 AM with need to vomit. He states that throughout the early morning he had 3 episodes of vomiting and 6 episodes of fairly watery diarrhea. No blood or mucous in diarrhea or emesis. He states that he has not had any vomiting or diarrhea since around 0600 AM. His abdominal pain/cramping has improved and his stomach now feels more uneasy. He states that he is unsure if this is because he is hungry, his stomach has been "growling." He has not eaten yet today. No specific alleviating/aggravating factors. Denies fever, chills, dysuria, testicular pain/swelling, recent foreign travel, or recent abx. No recent increase in NSAIDs or EtOH.   HPI  Past Medical History:  Diagnosis Date  . Cancer [facility])    Stage 4 prostate  . GERD (gastroesophageal reflux disease)    diagnosed with - +yes- GERD, occas. use of chewable antacid   . Hernia, umbilical   . Vertigo     Patient Active Problem List   Diagnosis Date Noted  . Incisional hernia 04/29/2015    Past Surgical History:  Procedure Laterality Date  . ABDOMINAL EXPLORATION SURGERY  04/29/2015   w/LOA  . ABDOMINAL EXPLORATION SURGERY  2014   S/P colonoscopy w/multiple polyps removed/notes 04/29/2015  . ABDOMINAL SURGERY  2014   due to puncture of bowel after removal of  polyps   . APPENDECTOMY  ~ 1987  . COLON SURGERY    . COLONOSCOPY W/ POLYPECTOMY  2014  . HERNIA REPAIR    . INCISIONAL HERNIA REPAIR  04/29/2015  . INCISIONAL HERNIA REPAIR N/A 04/29/2015   Procedure: OPEN INCISIONAL HERNIA REPAIR WITH MESH WITH MYOFACIAL RELEASE;  Surgeon: Rolm Bookbinder, MD;  Location: [facility];  Service: General;  Laterality: N/A;  . KNEE ARTHROSCOPY Right 02/09/2016   Procedure: ARTHROSCOPY KNEE;  Surgeon: Frederik Pear, MD;  Location: [facility];  Service: Orthopedics;  Laterality: Right;  ARTHROSCOPY KNEE, medial meniscal tear, chondromalacia        Home Medications    Prior to Admission medications   Medication Sig Start Date End Date Taking? Authorizing Provider  albuterol (PROVENTIL HFA;VENTOLIN HFA) 108 (90 Base) MCG/ACT inhaler Inhale 2 puffs every 4 (four) hours as needed into the lungs for wheezing or shortness of breath. Patient not taking: Reported on 08/27/2017 03/05/17   Noemi Chapel, MD  benzonatate (TESSALON) 100 MG capsule Take 1 capsule (100 mg total) every 8 (eight) hours by mouth. Patient not taking: Reported on 08/27/2017 03/05/17   Noemi Chapel, MD  clindamycin (CLEOCIN) 150 MG capsule Take 1 capsule (150 mg total) by mouth every 6 (six) hours. Patient not taking: Reported on 03/05/2017 05/01/16   Davonna Belling, MD  ibuprofen (ADVIL,MOTRIN) 400 MG tablet Take 1 tablet (400 mg total) by mouth every 6 (six) hours as needed. Patient not taking: Reported  on 03/05/2017 01/13/17   Palumbo, April, MD  meclizine (ANTIVERT) 25 MG tablet Take 1 tablet (25 mg total) by mouth 3 (three) times daily as needed for dizziness. Patient not taking: Reported on 08/27/2017 05/22/17   Malvin Johns, MD  predniSONE (DELTASONE) 20 MG tablet 3 tabs po day one, then 2 po daily x 4 days Patient not taking: Reported on 03/05/2017 01/13/17   Randal Buba, April, MD    Family History Family History  Problem Relation Age of Onset  . Diabetes Mother   . Heart disease  Sister   . Diabetes Maternal Grandfather     Social History Social History   Tobacco Use  . Smoking status: Former Smoker    Packs/day: 0.05    Years: 43.00    Pack years: 2.15    Types: Cigarettes    Last attempt to quit: 06/22/2014    Years since quitting: 3.1  . Smokeless tobacco: Never Used  Substance Use Topics  . Alcohol use: No  . Drug use: No     Allergies   Penicillins   Review of Systems Review of Systems  Constitutional: Negative for chills and fever.  Respiratory: Negative for shortness of breath.   Cardiovascular: Negative for chest pain.  Gastrointestinal: Positive for abdominal pain, diarrhea, nausea and vomiting. Negative for blood in stool and constipation.  Genitourinary: Negative for dysuria, scrotal swelling and testicular pain.  All other systems reviewed and are negative.    Physical Exam Updated Vital Signs BP 112/68   Pulse 60   Temp 98.1 F (36.7 C) (Oral)   Resp 16   SpO2 100%   Physical Exam  Constitutional: He appears well-developed and well-nourished. No distress.  HENT:  Head: Normocephalic and atraumatic.  Eyes: Conjunctivae are normal. Right eye exhibits no discharge. Left eye exhibits no discharge.  Cardiovascular: Normal rate and regular rhythm.  No murmur heard. Pulmonary/Chest: Breath sounds normal. No respiratory distress. He has no wheezes. He has no rales.  Abdominal: Soft. He exhibits no distension. There is tenderness in the right upper quadrant and right lower quadrant. There is no rigidity, no rebound, no guarding, no CVA tenderness and negative Murphy's sign.  Midline heeled surgical scar  Neurological: He is alert.  Clear speech.   Skin: Skin is warm and dry. No rash noted.  Psychiatric: He has a normal mood and affect. His behavior is normal.  Nursing note and vitals reviewed.   ED Treatments / Results  Labs (all labs ordered are listed, but only abnormal results are displayed) Labs Reviewed  COMPREHENSIVE  METABOLIC PANEL - Abnormal; Notable for the following components:      Result Value   Glucose, Bld 105 (*)    Calcium 8.8 (*)    All other components within normal limits  CBC - Abnormal; Notable for the following components:   WBC 3.9 (*)    All other components within normal limits  LIPASE, BLOOD  URINALYSIS, ROUTINE W REFLEX MICROSCOPIC    EKG None  Radiology No results found.  Procedures Procedures (including critical care time)  Medications Ordered in ED Medications - No data to display   Initial Impression / Assessment and Plan / ED Course  I have reviewed the triage vital signs and the nursing notes.  Pertinent labs & imaging results that were available during my care of the patient were reviewed by me and considered in my medical decision making (see chart for details).   Patient presents with N/V/D and abdominal cramping improved  upon my evaluation. He is nontoxic appearing, in no apparent distress, vitals WNL- notable for mild intermittent bradycardia. On exam he has some mild tenderness in the RUQ/RLQ- he states this is baseline for him and unchanged. There are no peritoneal signs. Labs per triage reviewed: mild leukopenia at 3.9 this is consistent with previous blood work. Mild hypocalcemia at 8.8, mild hyperglycemia at 105. Otherwise grossly unremarkable. No anemia. Lipase, renal fxn, and LFTs are WNL. UA does not appear with gross infection. Low suspicion for bowel obstruction, bowel perforation, cholecystitis, or diverticulitis. Given acuity and description of sxs do not suspect cancer related etiology.   15:25: CONSULT: Patient tolerating PO without difficulty in the ER. Feeling improved and states he is ready to go home.   Will discharge home with a few tablets of zofran for any continued vomiting. Will have patient follow up with PCP with strict return precautions. I discussed results, treatment plan, need for PCP follow-up, and return precautions with the patient.  Provided opportunity for questions, patient confirmed understanding and is in agreement with plan.   Findings and plan of care discussed with supervising physician Dr. Kathrynn Humble who has personally evaluated and examined this patient and is in agreement with plan.    Final Clinical Impressions(s) / ED Diagnoses   Final diagnoses:  Nausea vomiting and diarrhea  Abdominal cramping    ED Discharge Orders        Ordered    ondansetron (ZOFRAN ODT) 4 MG disintegrating tablet  Every 8 hours PRN     08/27/17 1537       Amity Roes, Elgin R, PA-C 08/27/17 1624    Varney Biles, MD 08/29/17 1750

## 2018-01-04 ENCOUNTER — Encounter (HOSPITAL_BASED_OUTPATIENT_CLINIC_OR_DEPARTMENT_OTHER): Payer: Self-pay

## 2018-01-04 ENCOUNTER — Other Ambulatory Visit: Payer: Self-pay

## 2018-01-04 ENCOUNTER — Emergency Department (HOSPITAL_BASED_OUTPATIENT_CLINIC_OR_DEPARTMENT_OTHER)
Admission: EM | Admit: 2018-01-04 | Discharge: 2018-01-04 | Disposition: A | Discharge status: Home / Self Care | Payer: BLUE CROSS/BLUE SHIELD | Attending: Emergency Medicine | Admitting: Emergency Medicine

## 2018-01-04 ENCOUNTER — Emergency Department (HOSPITAL_BASED_OUTPATIENT_CLINIC_OR_DEPARTMENT_OTHER): Payer: BLUE CROSS/BLUE SHIELD

## 2018-01-04 DIAGNOSIS — J069 Acute upper respiratory infection, unspecified: Secondary | ICD-10-CM | POA: Insufficient documentation

## 2018-01-04 DIAGNOSIS — Z87891 Personal history of nicotine dependence: Secondary | ICD-10-CM | POA: Diagnosis not present

## 2018-01-04 DIAGNOSIS — B9789 Other viral agents as the cause of diseases classified elsewhere: Secondary | ICD-10-CM | POA: Diagnosis not present

## 2018-01-04 DIAGNOSIS — R05 Cough: Secondary | ICD-10-CM | POA: Diagnosis present

## 2018-01-04 MED ORDER — ONDANSETRON HCL 4 MG/2ML IJ SOLN
4.0000 mg | Freq: Once | INTRAMUSCULAR | Status: AC
  Administered 2018-01-04: 4 mg via INTRAVENOUS
  Filled 2018-01-04: qty 2

## 2018-01-04 MED ORDER — ALBUTEROL SULFATE HFA 108 (90 BASE) MCG/ACT IN AERS
2.0000 | INHALATION_SPRAY | Freq: Once | RESPIRATORY_TRACT | Status: AC
  Administered 2018-01-04: 2 via RESPIRATORY_TRACT
  Filled 2018-01-04: qty 6.7

## 2018-01-04 MED ORDER — KETOROLAC TROMETHAMINE 30 MG/ML IJ SOLN
15.0000 mg | Freq: Once | INTRAMUSCULAR | Status: AC
  Administered 2018-01-04: 15 mg via INTRAVENOUS
  Filled 2018-01-04: qty 1

## 2018-01-04 MED ORDER — SODIUM CHLORIDE 0.9 % IV BOLUS
1000.0000 mL | Freq: Once | INTRAVENOUS | Status: AC
  Administered 2018-01-04: 1000 mL via INTRAVENOUS

## 2018-01-04 MED ORDER — GUAIFENESIN-CODEINE 100-10 MG/5ML PO SOLN: 10.0000 mL | Freq: Three times a day (TID) | ORAL | 0 refills | Status: DC | PRN

## 2018-01-04 MED ORDER — ONDANSETRON 4 MG PO TBDP: ORAL_TABLET | ORAL | 0 refills | Status: DC

## 2018-01-04 MED ORDER — GUAIFENESIN-CODEINE 100-10 MG/5ML PO SOLN
10.0000 mL | Freq: Once | ORAL | Status: AC
  Administered 2018-01-04: 10 mL via ORAL
  Filled 2018-01-04: qty 10

## 2018-01-04 NOTE — ED Provider Notes (Signed)
[place] EMERGENCY DEPARTMENT Provider Note   CSN: [id number] Arrival date & time: 01/04/18  1654     History   Chief Complaint Chief Complaint  Patient presents with  . Cough    HPI Mitchell Adams is a 57 y.o. male.  Mitchell Adams is a 57 y.o. Male with a history of GERD and prostate cancer, who presents to the emergency department for evaluation of 4 days of cough, nasal congestion and rhinorrhea.  Patient reports symptoms were fairly mild initially but have gotten progressively worse and today he has had severe dry cough, with a few episodes of posttussive emesis.  No hematemesis.  He reports shortness of breath during a severe coughing spell but otherwise no shortness of breath or chest pain.  He has had some chills but no fevers.  No headache or neck stiffness.  No ear pain.  Despite few episodes of emesis after coughing he denies any abdominal pain, diarrhea or constipation.  He has taken nothing to try and control his symptoms prior to arrival.  No other aggravating or alleviating factors.  No known sick contacts.  Patient did have episode of posttussive emesis while in the room collecting history.     Past Medical History:  Diagnosis Date  . Cancer [facility])    Stage 4 prostate  . GERD (gastroesophageal reflux disease)    diagnosed with - +yes- GERD, occas. use of chewable antacid   . Hernia, umbilical   . Vertigo     Patient Active Problem List   Diagnosis Date Noted  . Incisional hernia 04/29/2015    Past Surgical History:  Procedure Laterality Date  . ABDOMINAL EXPLORATION SURGERY  04/29/2015   w/LOA  . ABDOMINAL EXPLORATION SURGERY  2014   S/P colonoscopy w/multiple polyps removed/notes 04/29/2015  . ABDOMINAL SURGERY  2014   due to puncture of bowel after removal of polyps   . APPENDECTOMY  ~ 1987  . COLON SURGERY    . COLONOSCOPY W/ POLYPECTOMY  2014  . HERNIA REPAIR    . INCISIONAL HERNIA REPAIR  04/29/2015  . INCISIONAL HERNIA REPAIR N/A  04/29/2015   Procedure: OPEN INCISIONAL HERNIA REPAIR WITH MESH WITH MYOFACIAL RELEASE;  Surgeon: Rolm Bookbinder, MD;  Location: [place];  Service: General;  Laterality: N/A;  . KNEE ARTHROSCOPY Right 02/09/2016   Procedure: ARTHROSCOPY KNEE;  Surgeon: Frederik Pear, MD;  Location: [place];  Service: Orthopedics;  Laterality: Right;  ARTHROSCOPY KNEE, medial meniscal tear, chondromalacia        Home Medications    Prior to Admission medications   Medication Sig Start Date End Date Taking? Authorizing Provider  guaiFENesin-codeine 100-10 MG/5ML syrup Take 10 mLs by mouth 3 (three) times daily as needed for cough. 01/04/18   Jacqlyn Larsen, PA-C  ondansetron (ZOFRAN ODT) 4 MG disintegrating tablet 4mg  ODT q4 hours prn nausea/vomit 01/04/18   Jacqlyn Larsen, PA-C    Family History Family History  Problem Relation Age of Onset  . Diabetes Mother   . Heart disease Sister   . Diabetes Maternal Grandfather     Social History Social History   Tobacco Use  . Smoking status: Former Smoker    Packs/day: 0.05    Years: 43.00    Pack years: 2.15    Types: Cigarettes    Last attempt to quit: 06/22/2014    Years since quitting: 3.5  . Smokeless tobacco: Never Used  Substance Use Topics  . Alcohol use: No  .  Drug use: No     Allergies   Penicillins   Review of Systems Review of Systems  Constitutional: Positive for chills. Negative for fever.  HENT: Positive for congestion, rhinorrhea, sinus pressure and sore throat. Negative for ear pain and trouble swallowing.   Respiratory: Positive for cough. Negative for chest tightness, shortness of breath and wheezing.   Cardiovascular: Negative for chest pain.  Gastrointestinal: Positive for nausea and vomiting. Negative for abdominal pain and diarrhea.  Musculoskeletal: Negative for neck pain and neck stiffness.  Skin: Negative for color change and rash.  Neurological: Negative for headaches.     Physical Exam Updated  Vital Signs BP 127/64 (BP Location: Left Arm)   Pulse 84   Temp 99.5 F (37.5 C) (Oral)   Resp 14   Ht 5' 7.5" (1.715 m)   Wt 86.6 kg   SpO2 95%   BMI 29.47 kg/m   Physical Exam  Constitutional: He appears well-developed and well-nourished. No distress.  HENT:  Head: Normocephalic and atraumatic.  TMs clear with good landmarks, moderate nasal mucosa edema with clear rhinorrhea, posterior oropharynx clear and moist, with some erythema, no edema or exudates, uvula midline  Eyes: Right eye exhibits no discharge. Left eye exhibits no discharge.  Neck: Neck supple.  No rigidity  Cardiovascular: Normal rate, regular rhythm, normal heart sounds and intact distal pulses.  Pulmonary/Chest: Effort normal and breath sounds normal. No respiratory distress.  Respirations equal and unlabored, patient able to speak in full sentences, lungs clear to auscultation bilaterally, intermittent dry cough during encounter  Abdominal: Soft. Bowel sounds are normal. He exhibits no distension and no mass. There is no tenderness. There is no guarding.  Abdomen soft, nondistended, nontender to palpation in all quadrants without guarding or peritoneal signs  Musculoskeletal: He exhibits no edema or deformity.  Neurological: He is alert. Coordination normal.  Skin: Skin is warm and dry. He is not diaphoretic.  Psychiatric: He has a normal mood and affect. His behavior is normal.  Nursing note and vitals reviewed.    ED Treatments / Results  Labs (all labs ordered are listed, but only abnormal results are displayed) Labs Reviewed - No data to display  EKG None  Radiology Dg Chest 2 View  Result Date: 01/04/2018 CLINICAL DATA:  Cough with chest tightness and shortness of breath 56 days. Nausea and vomiting last night. EXAM: CHEST - 2 VIEW COMPARISON:  03/05/2017 FINDINGS: Lungs are adequately inflated and otherwise clear. Cardiomediastinal silhouette and remainder of the exam is within normal. IMPRESSION:  No active cardiopulmonary disease. Electronically Signed   By: Marin Olp M.D.   On: 01/04/2018 17:38    Procedures Procedures (including critical care time)  Medications Ordered in ED Medications  sodium chloride 0.9 % bolus 1,000 mL (0 mLs Intravenous Stopped 01/04/18 1948)  ondansetron (ZOFRAN) injection 4 mg (4 mg Intravenous Given 01/04/18 1754)  ketorolac (TORADOL) 30 MG/ML injection 15 mg (15 mg Intravenous Given 01/04/18 1757)  guaiFENesin-codeine 100-10 MG/5ML solution 10 mL (10 mLs Oral Given 01/04/18 1758)  albuterol (PROVENTIL HFA;VENTOLIN HFA) 108 (90 Base) MCG/ACT inhaler 2 puff (2 puffs Inhalation Given 01/04/18 1843)     Initial Impression / Assessment and Plan / ED Course  I have reviewed the triage vital signs and the nursing notes.  Pertinent labs & imaging results that were available during my care of the patient were reviewed by me and considered in my medical decision making (see chart for details).  Patient with symptoms consistent with  influenza.  Vitals are stable, low-grade fever. Improved with medication.  IV fluids given, pt tolerating PO.  Lungs are clear. CXR without signs of pneumonia, or other active cardiopulmonary disease. Discussed the cost versus benefit of Tamiflu treatment with the patient.  The patient understands that symptoms are greater than the recommended 24-48 hour window of treatment, no tamiflu provided.  Has had some episodes of posttussive emesis, Zofran provided, treated with cough syrup in the ED as well with significant improvement in symptoms.  He reports severe coughing spells, and has history of multiple episodes of bronchitis, provided albuterol inhaler to help with this.  Patient will be discharged with instructions to orally hydrate, rest, and use over-the-counter medications such as anti-inflammatories ibuprofen and Aleve for muscle aches and Tylenol for fever.  Patient will also be given a cough suppressant and Zofran.  Return precautions  discussed.  Patient follow-up with PCP.  He expresses understanding and is in agreement with plan.  Stable for discharge home.    Final Clinical Impressions(s) / ED Diagnoses   Final diagnoses:  Viral URI with cough    ED Discharge Orders         Ordered    guaiFENesin-codeine 100-10 MG/5ML syrup  3 times daily PRN     01/04/18 1928    ondansetron (ZOFRAN ODT) 4 MG disintegrating tablet     01/04/18 1928           Jacqlyn Larsen, [place] 01/04/18 2000    Margette Fast, MD 01/05/18 1018

## 2018-01-04 NOTE — ED Triage Notes (Addendum)
C/o flu like sx day x 4 days-states "feels like when I have bronchitis"-NAD-steady gait

## 2018-01-04 NOTE — Discharge Instructions (Addendum)
Your symptoms are likely caused by a viral upper respiratory infection. Antibiotics are not helpful in treating viral infection, the virus should run its course in about 5-7 days. Please make sure you are drinking plenty of fluids. You can treat your symptoms supportively with tylenol/ibuprofen for fevers and pains, Zyrtec and Flonase to help with nasal congestion, and prescribed cough syrup and throat lozenges to help with cough.  You can also use albuterol inhaler to help with coughing spells.  Zofran as needed for nausea.  If your symptoms are not improving please follow up with you Primary doctor.   If you develop persistent fevers, shortness of breath or difficulty breathing, chest pain, severe headache and neck pain, persistent nausea and vomiting or other new or concerning symptoms return to the Emergency department.

## 2019-07-29 ENCOUNTER — Encounter (HOSPITAL_COMMUNITY): Payer: Self-pay | Admitting: Pediatrics

## 2019-07-29 ENCOUNTER — Emergency Department (HOSPITAL_COMMUNITY): Payer: BC Managed Care – PPO

## 2019-07-29 ENCOUNTER — Other Ambulatory Visit: Payer: Self-pay

## 2019-07-29 ENCOUNTER — Inpatient Hospital Stay (HOSPITAL_COMMUNITY)
Admission: EM | Admit: 2019-07-29 | Discharge: 2019-07-30 | DRG: 287 | Disposition: A | Discharge status: Home / Self Care | Payer: BC Managed Care – PPO | Attending: Cardiovascular Disease | Admitting: Cardiovascular Disease

## 2019-07-29 DIAGNOSIS — I200 Unstable angina: Secondary | ICD-10-CM

## 2019-07-29 DIAGNOSIS — K219 Gastro-esophageal reflux disease without esophagitis: Secondary | ICD-10-CM | POA: Diagnosis present

## 2019-07-29 DIAGNOSIS — Z87891 Personal history of nicotine dependence: Secondary | ICD-10-CM

## 2019-07-29 DIAGNOSIS — Z833 Family history of diabetes mellitus: Secondary | ICD-10-CM

## 2019-07-29 DIAGNOSIS — Z8249 Family history of ischemic heart disease and other diseases of the circulatory system: Secondary | ICD-10-CM

## 2019-07-29 DIAGNOSIS — T466X6A Underdosing of antihyperlipidemic and antiarteriosclerotic drugs, initial encounter: Secondary | ICD-10-CM | POA: Diagnosis present

## 2019-07-29 DIAGNOSIS — Z880 Allergy status to penicillin: Secondary | ICD-10-CM

## 2019-07-29 DIAGNOSIS — Z20822 Contact with and (suspected) exposure to covid-19: Secondary | ICD-10-CM | POA: Diagnosis present

## 2019-07-29 DIAGNOSIS — E785 Hyperlipidemia, unspecified: Secondary | ICD-10-CM | POA: Diagnosis present

## 2019-07-29 DIAGNOSIS — Z8546 Personal history of malignant neoplasm of prostate: Secondary | ICD-10-CM

## 2019-07-29 DIAGNOSIS — Z9114 Patient's other noncompliance with medication regimen: Secondary | ICD-10-CM

## 2019-07-29 LAB — BASIC METABOLIC PANEL
Anion gap: 9 (ref 5–15)
BUN: 12 mg/dL (ref 6–20)
CO2: 25 mmol/L (ref 22–32)
Calcium: 9.1 mg/dL (ref 8.9–10.3)
Chloride: 106 mmol/L (ref 98–111)
Creatinine, Ser: 0.93 mg/dL (ref 0.61–1.24)
GFR calc Af Amer: >60 mL/min (ref >60)
GFR calc non Af Amer: >60 mL/min (ref >60)
Glucose, Bld: 125 mg/dL — ABNORMAL HIGH (ref 70–99)
Potassium: 4 mmol/L (ref 3.5–5.1)
Sodium: 140 mmol/L (ref 135–145)

## 2019-07-29 LAB — CBC
HCT: 40.3 % (ref 39.0–52.0)
Hemoglobin: 13.1 g/dL (ref 13.0–17.0)
MCH: 31.6 pg (ref 26.0–34.0)
MCHC: 32.5 g/dL (ref 30.0–36.0)
MCV: 97.3 fL (ref 80.0–100.0)
Platelets: 243 K/uL (ref 150–400)
RBC: 4.14 MIL/uL — ABNORMAL LOW (ref 4.22–5.81)
RDW: 11.7 % (ref 11.5–15.5)
WBC: 5.2 K/uL (ref 4.0–10.5)
nRBC: 0 % (ref 0.0–0.2)

## 2019-07-29 LAB — TROPONIN I (HIGH SENSITIVITY)
Troponin I (High Sensitivity): 3 ng/L (ref <18)
Troponin I (High Sensitivity): 3 ng/L (ref <18)

## 2019-07-29 MED ORDER — SODIUM CHLORIDE 0.9% FLUSH: 3.0000 mL | Freq: Once | INTRAVENOUS | Status: DC

## 2019-07-29 NOTE — ED Provider Notes (Signed)
[place] EMERGENCY DEPARTMENT Provider Note   CSN: [id number] Arrival date & time: 07/29/19  1352     History Chief Complaint  Patient presents with  . Chest Pain    Mitchell Adams is a 59 y.o. male with a history of hyperlipidemia, stage IV prostate cancer, vertigo, and GERD who presents to the emergency department by EMS with a chief complaint of chest pain.  The patient developed left-sided "squeezing, pressure-like" chest pain and tightness that began at 12:15.  Reports that he was walking at work when the pain began.  He reports that the pain radiated down the left arm, but stops at the left upper arm.  Shortly after the pain began he developed shortness of breath, nausea, and had 3 episodes of dark brown emesis.  He reports that he felt very weak and generally unwell.  He had some intermittent diaphoresis with onset of chest pain.  He attempted to cough several times to catch his breath with no improvement in his symptoms.  No coughing prior to onset of pain.  His coworkers attempted to have him sit down, but symptoms did not improve.  He was given NTG x4 in route with EMS along with ASA 324 mg.  The pain was constant and severe until he received nitroglycerin in route with EMS.  There was some improvement of the pain and it seemed to wax and wane, but never resolved.  He currently has mild chest pain that has persisted all afternoon and evening.  Shortness of breath, nausea, and vomiting have since resolved.  He has a history of hyperlipidemia.  He has not been taking his on Lipitor for the last month.  He has never seen a cardiologist and has never had a stress test or an echo.  He reports that his sister passed away from an MI at age 61.  His mother had an MI in her 29s.  He reports that both of his parents develop CAD after the age of 52.   The history is provided by the patient. No language interpreter was used.       Past Medical History:  Diagnosis Date    . Cancer [facility])    Stage 4 prostate  . GERD (gastroesophageal reflux disease)    diagnosed with - +yes- GERD, occas. use of chewable antacid   . Hernia, umbilical   . Vertigo     Patient Active Problem List   Diagnosis Date Noted  . Unstable angina ([place]) 07/30/2019  . Incisional hernia 04/29/2015    Past Surgical History:  Procedure Laterality Date  . ABDOMINAL EXPLORATION SURGERY  04/29/2015   w/LOA  . ABDOMINAL EXPLORATION SURGERY  2014   S/P colonoscopy w/multiple polyps removed/notes 04/29/2015  . ABDOMINAL SURGERY  2014   due to puncture of bowel after removal of polyps   . APPENDECTOMY  ~ 1987  . COLON SURGERY    . COLONOSCOPY W/ POLYPECTOMY  2014  . HERNIA REPAIR    . INCISIONAL HERNIA REPAIR  04/29/2015  . INCISIONAL HERNIA REPAIR N/A 04/29/2015   Procedure: OPEN INCISIONAL HERNIA REPAIR WITH MESH WITH MYOFACIAL RELEASE;  Surgeon: Rolm Bookbinder, MD;  Location: [place];  Service: General;  Laterality: N/A;  . KNEE ARTHROSCOPY Right 02/09/2016   Procedure: ARTHROSCOPY KNEE;  Surgeon: Frederik Pear, MD;  Location: [place];  Service: Orthopedics;  Laterality: Right;  ARTHROSCOPY KNEE, medial meniscal tear, chondromalacia       Family History  Problem Relation Age  of Onset  . Diabetes Mother   . Heart disease Sister   . Diabetes Maternal Grandfather     Social History   Tobacco Use  . Smoking status: Former Smoker    Packs/day: 0.05    Years: 43.00    Pack years: 2.15    Types: Cigarettes    Quit date: 06/22/2014    Years since quitting: 5.1  . Smokeless tobacco: Never Used  Substance Use Topics  . Alcohol use: No  . Drug use: No    Home Medications Prior to Admission medications   Medication Sig Start Date End Date Taking? Authorizing Provider  guaiFENesin-codeine 100-10 MG/5ML syrup Take 10 mLs by mouth 3 (three) times daily as needed for cough. Patient not taking: Reported on 07/29/2019 01/04/18   Jacqlyn Larsen, PA-C  ondansetron (ZOFRAN  ODT) 4 MG disintegrating tablet 4mg  ODT q4 hours prn nausea/vomit Patient not taking: Reported on 07/29/2019 01/04/18   Jacqlyn Larsen, PA-C    Allergies    Penicillins  Review of Systems   Review of Systems  Constitutional: Positive for diaphoresis. Negative for appetite change and fever.  HENT: Negative for congestion and sore throat.   Respiratory: Positive for chest tightness and shortness of breath.   Cardiovascular: Positive for chest pain. Negative for palpitations and leg swelling.  Gastrointestinal: Positive for nausea and vomiting. Negative for abdominal pain, blood in stool, constipation and diarrhea.  Genitourinary: Negative for dysuria and urgency.  Musculoskeletal: Negative for arthralgias, back pain, gait problem, myalgias, neck pain and neck stiffness.  Skin: Negative for rash.  Allergic/Immunologic: Negative for immunocompromised state.  Neurological: Positive for weakness (generalized ). Negative for dizziness, seizures, syncope, numbness and headaches.  Psychiatric/Behavioral: Negative for confusion.    Physical Exam Updated Vital Signs BP (!) 126/109   Pulse (!) 58   Temp 97.7 F (36.5 C) (Oral)   Resp 13   Ht 5\' 7"  (1.702 m)   Wt 90.7 kg   SpO2 98%   BMI 31.32 kg/m   Physical Exam Vitals and nursing note reviewed.  Constitutional:      General: He is not in acute distress.    Appearance: He is well-developed. He is not ill-appearing, toxic-appearing or diaphoretic.  HENT:     Head: Normocephalic.  Eyes:     Conjunctiva/sclera: Conjunctivae normal.  Cardiovascular:     Rate and Rhythm: Normal rate and regular rhythm.     Pulses: Normal pulses.     Heart sounds: Normal heart sounds. No murmur. No friction rub. No gallop.      Comments: Peripheral pulses are 2+ and symmetric. Pulmonary:     Effort: Pulmonary effort is normal. No respiratory distress.     Breath sounds: No stridor. No wheezing, rhonchi or rales.     Comments: No reproducible chest  pain Chest:     Chest wall: No tenderness.  Abdominal:     General: There is distension.     Palpations: Abdomen is soft. There is no mass.     Tenderness: There is no abdominal tenderness. There is no right CVA tenderness, left CVA tenderness, guarding or rebound.     Hernia: No hernia is present.     Comments: Abdomen is moderately distended and hard.  Nontender to palpation.  No evidence of anasarca.  Musculoskeletal:     Cervical back: Neck supple.     Right lower leg: No edema.     Left lower leg: No edema.  Skin:  General: Skin is warm and dry.  Neurological:     Mental Status: He is alert.  Psychiatric:        Behavior: Behavior normal.     ED Results / Procedures / Treatments   Labs (all labs ordered are listed, but only abnormal results are displayed) Labs Reviewed  BASIC METABOLIC PANEL - Abnormal; Notable for the following components:      Result Value   Glucose, Bld 125 (*)    All other components within normal limits  CBC - Abnormal; Notable for the following components:   RBC 4.14 (*)    All other components within normal limits  SARS CORONAVIRUS 2 (TAT 6-24 HRS)  TROPONIN I (HIGH SENSITIVITY)  TROPONIN I (HIGH SENSITIVITY)    EKG EKG Interpretation  Date/Time:  Tuesday July 29 2019 14:06:44 EDT Ventricular Rate:  82 PR Interval:  148 QRS Duration: 94 QT Interval:  358 QTC Calculation: 418 R Axis:   80 Text Interpretation: Normal sinus rhythm T wave abnormality Artifact Abnormal ekg Confirmed by Carmin Muskrat [phone]) on 07/29/2019 9:54:15 PM   Radiology DG Chest 2 View  Result Date: 07/29/2019 CLINICAL DATA:  Chest pain EXAM: CHEST - 2 VIEW COMPARISON:  2019 FINDINGS: The heart size and mediastinal contours are within normal limits. Both lungs are clear. No pleural effusion or pneumothorax. The visualized skeletal structures are unremarkable. IMPRESSION: No acute process in the chest Electronically Signed   By: Macy Mis M.D.   On: 07/29/2019  14:34    Procedures Procedures (including critical care time)  Medications Ordered in ED Medications  sodium chloride flush (NS) 0.9 % injection 3 mL (has no administration in time range)  fentaNYL (SUBLIMAZE) injection 75 mcg (75 mcg Intravenous Given 07/30/19 0054)  ondansetron (ZOFRAN) injection 4 mg (4 mg Intravenous Given 07/30/19 0053)    ED Course  I have reviewed the triage vital signs and the nursing notes.  Pertinent labs & imaging results that were available during my care of the patient were reviewed by me and considered in my medical decision making (see chart for details).    MDM Rules/Calculators/A&P                      59 year old male with a history of hyperlipidemia, stage IV prostate cancer, vertigo, and GERD presenting by EMS after he developed chest pain that radiated down his left arm accompanied by shortness of breath, nausea, vomiting, diaphoresis, generalized weakness while at work today.  He has a history of hyperlipidemia that is untreated.  Reports that his sister passed away from an MI at the age of 83.  He continues to have mild, persistent chest pain in the ER.  He notes that there is no improvement in his chest pain until he received nitroglycerin in route with EMS.  His other symptoms have resolved.  Reports that he has had mild episodes of chest pain with exertion previously.  He has not established with cardiology and has not undergone work-up, including stress test and echo.  EKG was sinus rhythm, but is nonspecific T wave flattening in the lateral chest leads.  No electrolyte derangements.  Chest x-ray is unremarkable. HEAR score is 5.   Discussed patient with Dr. Vanita Panda, attending physician.  There is some concerning aspects of his story and given that he has not established with cardiology and has an elevated hear score, he would benefit from cardiology consult. Doubt PE, aortic dissection, esophageal rupture given his risk factors,  episode today is  concerning for unstable angina.    Consulted cardiology and Dr. Paticia Stack has evaluated the patient in the ER.  He will accept the patient for admission. The patient appears reasonably stabilized for admission considering the current resources, flow, and capabilities available in the ED at this time, and I doubt any other [facility] requiring further screening and/or treatment in the ED prior to admission.  Final Clinical Impression(s) / ED Diagnoses Final diagnoses:  Unstable angina [facility])    Rx / DC Orders ED Discharge Orders    None       Joanne Gavel, PA-C 07/30/19 Mathis Fare, MD 07/30/19 2333

## 2019-07-29 NOTE — ED Triage Notes (Signed)
Arrived via EMS; c/o chest pain while at work; EMS endosed given NTG x4 + ASA 324 mg and patient has some relief. Patient stated he also had an episode of vomiting PTA.

## 2019-07-29 NOTE — ED Triage Notes (Signed)
Pt. Stated, I was working and started having SOB with nausea with vomiting several times.  Then I had chest tightness. This started at [employer]

## 2019-07-29 NOTE — ED Notes (Signed)
While pt's vital signs were being updated, pt became upset about the wait stating "I've been here for 8 hour and my vital signs are just now being taken. People are leaving that I was here before." This Probation officer informed the pt that currently there are not any rooms available and that there are 20 plus pts waiting for admission beds. This Probation officer also informed that the sickest pt's will be rooms first and then the longest wait. The pt began yelling at this staff member stating "I came in for chest pain, is that not sick." This writer informed the pt that I was not say he was not sick. Blood work and an EKG was taken and those results will also determine his acuity level. H was told all results can be accessed through [place].

## 2019-07-30 ENCOUNTER — Inpatient Hospital Stay (HOSPITAL_COMMUNITY): Payer: BC Managed Care – PPO

## 2019-07-30 ENCOUNTER — Encounter (HOSPITAL_COMMUNITY)
Admission: EM | Disposition: A | Discharge status: Home / Self Care | Payer: Self-pay | Source: Home / Self Care | Attending: Cardiovascular Disease

## 2019-07-30 DIAGNOSIS — Z833 Family history of diabetes mellitus: Secondary | ICD-10-CM | POA: Diagnosis not present

## 2019-07-30 DIAGNOSIS — T466X6A Underdosing of antihyperlipidemic and antiarteriosclerotic drugs, initial encounter: Secondary | ICD-10-CM | POA: Diagnosis present

## 2019-07-30 DIAGNOSIS — Z8249 Family history of ischemic heart disease and other diseases of the circulatory system: Secondary | ICD-10-CM | POA: Diagnosis not present

## 2019-07-30 DIAGNOSIS — R079 Chest pain, unspecified: Secondary | ICD-10-CM

## 2019-07-30 DIAGNOSIS — K219 Gastro-esophageal reflux disease without esophagitis: Secondary | ICD-10-CM | POA: Diagnosis present

## 2019-07-30 DIAGNOSIS — Z20822 Contact with and (suspected) exposure to covid-19: Secondary | ICD-10-CM | POA: Diagnosis present

## 2019-07-30 DIAGNOSIS — E785 Hyperlipidemia, unspecified: Secondary | ICD-10-CM | POA: Diagnosis present

## 2019-07-30 DIAGNOSIS — I200 Unstable angina: Secondary | ICD-10-CM | POA: Diagnosis present

## 2019-07-30 DIAGNOSIS — Z880 Allergy status to penicillin: Secondary | ICD-10-CM | POA: Diagnosis not present

## 2019-07-30 DIAGNOSIS — Z9114 Patient's other noncompliance with medication regimen: Secondary | ICD-10-CM | POA: Diagnosis not present

## 2019-07-30 DIAGNOSIS — Z87891 Personal history of nicotine dependence: Secondary | ICD-10-CM | POA: Diagnosis not present

## 2019-07-30 DIAGNOSIS — Z8546 Personal history of malignant neoplasm of prostate: Secondary | ICD-10-CM | POA: Diagnosis not present

## 2019-07-30 HISTORY — PX: LEFT HEART CATH AND CORONARY ANGIOGRAPHY: CATH118249

## 2019-07-30 LAB — CBC
HCT: 40.4 % (ref 39.0–52.0)
Hemoglobin: 13.2 g/dL (ref 13.0–17.0)
MCH: 32 pg (ref 26.0–34.0)
MCHC: 32.7 g/dL (ref 30.0–36.0)
MCV: 97.8 fL (ref 80.0–100.0)
Platelets: 227 10*3/uL (ref 150–400)
RBC: 4.13 MIL/uL — ABNORMAL LOW (ref 4.22–5.81)
RDW: 11.8 % (ref 11.5–15.5)
WBC: 6.2 10*3/uL (ref 4.0–10.5)
nRBC: 0 % (ref 0.0–0.2)

## 2019-07-30 LAB — CREATININE, SERUM
Creatinine, Ser: 0.9 mg/dL (ref 0.61–1.24)
GFR calc Af Amer: >60 mL/min (ref >60)
GFR calc non Af Amer: >60 mL/min (ref >60)

## 2019-07-30 LAB — ECHOCARDIOGRAM COMPLETE
Height: 67 in
Weight: 3200 oz

## 2019-07-30 LAB — SARS CORONAVIRUS 2 (TAT 6-24 HRS): SARS Coronavirus 2: NEGATIVE

## 2019-07-30 LAB — LIPID PANEL
Cholesterol: 202 mg/dL — ABNORMAL HIGH (ref 0–200)
HDL: 28 mg/dL — ABNORMAL LOW (ref >40)
LDL Cholesterol: 119 mg/dL — ABNORMAL HIGH (ref 0–99)
Total CHOL/HDL Ratio: 7.2 RATIO
Triglycerides: 273 mg/dL — ABNORMAL HIGH (ref <150)
VLDL: 55 mg/dL — ABNORMAL HIGH (ref 0–40)

## 2019-07-30 LAB — TROPONIN I (HIGH SENSITIVITY)
Troponin I (High Sensitivity): 3 ng/L (ref <18)
Troponin I (High Sensitivity): 3 ng/L (ref <18)

## 2019-07-30 LAB — PROTIME-INR
INR: 1 (ref 0.8–1.2)
Prothrombin Time: 13.1 seconds (ref 11.4–15.2)

## 2019-07-30 LAB — HIV ANTIBODY (ROUTINE TESTING W REFLEX): HIV Screen 4th Generation wRfx: NONREACTIVE

## 2019-07-30 SURGERY — LEFT HEART CATH AND CORONARY ANGIOGRAPHY
Anesthesia: LOCAL

## 2019-07-30 MED ORDER — SODIUM CHLORIDE 0.9 % WEIGHT BASED INFUSION: 1.0000 mL/kg/h | INTRAVENOUS | Status: DC

## 2019-07-30 MED ORDER — ONDANSETRON HCL 4 MG/2ML IJ SOLN: 4.0000 mg | Freq: Four times a day (QID) | INTRAMUSCULAR | Status: DC | PRN

## 2019-07-30 MED ORDER — ONDANSETRON HCL 4 MG/2ML IJ SOLN
4.0000 mg | Freq: Once | INTRAMUSCULAR | Status: AC
  Administered 2019-07-30: 4 mg via INTRAVENOUS
  Filled 2019-07-30: qty 2

## 2019-07-30 MED ORDER — MIDAZOLAM HCL 2 MG/2ML IJ SOLN
INTRAMUSCULAR | Status: AC
  Filled 2019-07-30: qty 2

## 2019-07-30 MED ORDER — ASPIRIN 81 MG PO CHEW: 81.0000 mg | CHEWABLE_TABLET | ORAL | Status: DC

## 2019-07-30 MED ORDER — FENTANYL CITRATE (PF) 100 MCG/2ML IJ SOLN
INTRAMUSCULAR | Status: AC
  Filled 2019-07-30: qty 2

## 2019-07-30 MED ORDER — HEPARIN SODIUM (PORCINE) 1000 UNIT/ML IJ SOLN
INTRAMUSCULAR | Status: AC
  Filled 2019-07-30: qty 1

## 2019-07-30 MED ORDER — ASPIRIN 81 MG PO CHEW
81.0000 mg | CHEWABLE_TABLET | ORAL | Status: AC
  Administered 2019-07-30: 81 mg via ORAL
  Filled 2019-07-30: qty 1

## 2019-07-30 MED ORDER — LIDOCAINE HCL (PF) 1 % IJ SOLN
INTRAMUSCULAR | Status: DC | PRN
  Administered 2019-07-30: 2 mL

## 2019-07-30 MED ORDER — ENOXAPARIN SODIUM 40 MG/0.4ML ~~LOC~~ SOLN
40.0000 mg | SUBCUTANEOUS | Status: DC
  Administered 2019-07-30: 40 mg via SUBCUTANEOUS
  Filled 2019-07-30: qty 0.4

## 2019-07-30 MED ORDER — ACETAMINOPHEN 325 MG PO TABS: 650.0000 mg | ORAL_TABLET | ORAL | Status: DC | PRN

## 2019-07-30 MED ORDER — HEPARIN (PORCINE) IN NACL 1000-0.9 UT/500ML-% IV SOLN
INTRAVENOUS | Status: DC | PRN
  Administered 2019-07-30: 500 mL

## 2019-07-30 MED ORDER — SODIUM CHLORIDE 0.9% FLUSH: 3.0000 mL | INTRAVENOUS | Status: DC | PRN

## 2019-07-30 MED ORDER — SODIUM CHLORIDE 0.9 % WEIGHT BASED INFUSION: 3.0000 mL/kg/h | INTRAVENOUS | Status: DC

## 2019-07-30 MED ORDER — FENTANYL CITRATE (PF) 100 MCG/2ML IJ SOLN
INTRAMUSCULAR | Status: DC | PRN
  Administered 2019-07-30: 50 ug via INTRAVENOUS

## 2019-07-30 MED ORDER — LIDOCAINE HCL (PF) 1 % IJ SOLN
INTRAMUSCULAR | Status: AC
  Filled 2019-07-30: qty 30

## 2019-07-30 MED ORDER — HEPARIN (PORCINE) IN NACL 1000-0.9 UT/500ML-% IV SOLN
INTRAVENOUS | Status: AC
  Filled 2019-07-30: qty 1000

## 2019-07-30 MED ORDER — SODIUM CHLORIDE 0.9 % IV SOLN: 250.0000 mL | INTRAVENOUS | Status: DC | PRN

## 2019-07-30 MED ORDER — ASPIRIN EC 81 MG PO TBEC: 81.0000 mg | DELAYED_RELEASE_TABLET | Freq: Every day | ORAL | Status: DC

## 2019-07-30 MED ORDER — SODIUM CHLORIDE 0.9 % IV SOLN: INTRAVENOUS | Status: DC

## 2019-07-30 MED ORDER — SODIUM CHLORIDE 0.9% FLUSH: 3.0000 mL | Freq: Two times a day (BID) | INTRAVENOUS | Status: DC

## 2019-07-30 MED ORDER — VERAPAMIL HCL 2.5 MG/ML IV SOLN
INTRAVENOUS | Status: AC
  Filled 2019-07-30: qty 2

## 2019-07-30 MED ORDER — FENTANYL CITRATE (PF) 100 MCG/2ML IJ SOLN
75.0000 ug | Freq: Once | INTRAMUSCULAR | Status: AC
  Administered 2019-07-30: 75 ug via INTRAVENOUS
  Filled 2019-07-30: qty 2

## 2019-07-30 MED ORDER — HEPARIN SODIUM (PORCINE) 1000 UNIT/ML IJ SOLN
INTRAMUSCULAR | Status: DC | PRN
  Administered 2019-07-30: 5000 [IU] via INTRAVENOUS

## 2019-07-30 MED ORDER — NITROGLYCERIN 0.4 MG SL SUBL: 0.4000 mg | SUBLINGUAL_TABLET | SUBLINGUAL | Status: DC | PRN

## 2019-07-30 MED ORDER — VERAPAMIL HCL 2.5 MG/ML IV SOLN
INTRAVENOUS | Status: DC | PRN
  Administered 2019-07-30: 10 mL via INTRA_ARTERIAL

## 2019-07-30 MED ORDER — MIDAZOLAM HCL 2 MG/2ML IJ SOLN
INTRAMUSCULAR | Status: DC | PRN
  Administered 2019-07-30: 1 mg via INTRAVENOUS

## 2019-07-30 MED ORDER — IOHEXOL 350 MG/ML SOLN
INTRAVENOUS | Status: DC | PRN
  Administered 2019-07-30: 15:00:00 40 mL

## 2019-07-30 MED ORDER — SODIUM CHLORIDE 0.9 % WEIGHT BASED INFUSION
3.0000 mL/kg/h | INTRAVENOUS | Status: DC
  Administered 2019-07-30: 3 mL/kg/h via INTRAVENOUS

## 2019-07-30 SURGICAL SUPPLY — 10 items
CATH INFINITI 5 FR JL3.5 (CATHETERS) ×1 IMPLANT
CATH INFINITI 5FR JK (CATHETERS) ×1 IMPLANT
DEVICE RAD COMP TR BAND LRG (VASCULAR PRODUCTS) ×1 IMPLANT
GLIDESHEATH SLEND SS 6F .021 (SHEATH) ×1 IMPLANT
GUIDEWIRE INQWIRE 1.5J.035X260 (WIRE) IMPLANT
INQWIRE 1.5J .035X260CM (WIRE) ×2
KIT HEART LEFT (KITS) ×2 IMPLANT
PACK CARDIAC CATHETERIZATION (CUSTOM PROCEDURE TRAY) ×2 IMPLANT
TRANSDUCER W/STOPCOCK (MISCELLANEOUS) ×2 IMPLANT
TUBING CIL FLEX 10 FLL-RA (TUBING) ×2 IMPLANT

## 2019-07-30 NOTE — Progress Notes (Signed)
Ambulated in hallway tol well  

## 2019-07-30 NOTE — Progress Notes (Signed)
Discharge instructions reviewed with pt and his wife (via telephone) Both voice understanding.  

## 2019-07-30 NOTE — Discharge Summary (Signed)
Discharge Summary    Patient ID: Mitchell Adams,  MRN: [id number], DOB/AGE: Oct 08, 1960 59 y.o.  Admit date: 07/29/2019 Discharge date: 07/31/2019  Primary Care Provider: [facility] Primary Cardiologist: No primary care provider on file.  Discharge Diagnoses    Principal Problem:   Unstable angina (HCC)  Allergies Allergies  Allergen Reactions  . Penicillins Hives and Other (See Comments)    Has patient had a PCN reaction causing immediate rash, facial/tongue/throat swelling, SOB or lightheadedness with hypotension: Yes Has patient had a PCN reaction causing severe rash involving mucus membranes or skin necrosis: No Has patient had a PCN reaction that required hospitalization No Pt. Reports increased heart rate  & faint feeling  Has patient had a PCN reaction occurring within the last 10 years: No If all of the above answers are "NO", then may proceed with Cephalosporin use.     Diagnostic Studies/Procedures    Cath: 07/30/19  1.  Normal coronary arteries. 2.  Left ventricular angiography was not performed.  EF was normal by echo.  Mildly elevated left ventricular end-diastolic pressure.  Recommendations: Noncardiac chest pain. Possible GI etiology.  Diagnostic Dominance: Right   _____________   History of Present Illness     Mitchell Adams is a 59 y.o. male with a history of stage IV prostate CA, hyperlipidemia, abdominal hernia and GERD who presented to the hospital with complaints of substernal chest pain. He described the pain as left-sided, pressure-like and radiating to the left arm. He also had associated dyspnea, nausea and vomiting.  He at least had 3 episodes of emesis. At one point he was also sweating profusely.  The pain lasted for greater than 1 hour.  He was given aspirin 324 mg and 4 doses of sublingual nitroglycerin by EMS.  He had only partial improvement in his chest pain with the nitrates.  Hours after the initial  onset of the chest pain he still continued to complain of 2/10 chest pain in the emergency department at [facility].  In the ED the blood pressure was 116/95 mmHg.  The heart rate was 55 bpm.  The ECG showed normal sinus rhythm with a ventricular rate of 82 bpm.  There was T wave flattening in the lateral leads.  There was voltage criteria for LVH.  The chest x-ray was normal. Cardiology was called for admission. Planned for cardiac cath.  Hospital Course     hsTn negative x2. He underwent cardiac cath noted above with normal coronaries. Echo showed normal EF with no rWMA noted, moderate LVH. Of note, LDL was noted at 119. Encouraged to follow up with PCP regarding management. Radial site stable prior to discharge. Instructions/precautions given prior to discharge via RN.   Mitchell Adams was seen by Dr. Burt Knack and determined stable for discharge home. Follow up in the office has been arranged. Medications are listed below.   _____________  Discharge Vitals Blood pressure 120/90, pulse 60, temperature 97.7 F (36.5 C), temperature source Oral, resp. rate 16, height 5\' 7"  (1.702 m), weight 90.7 kg, SpO2 96 %.  Filed Weights   07/29/19 1405  Weight: 90.7 kg    Labs & Radiologic Studies    CBC Recent Labs    07/29/19 1421 07/30/19 0653  WBC 5.2 6.2  HGB 13.1 13.2  HCT 40.3 40.4  MCV 97.3 97.8  PLT 243 [id number]   Basic Metabolic Panel Recent Labs    07/29/19 1421 07/30/19 0653  NA 140  --  K 4.0  --   CL 106  --   CO2 25  --   GLUCOSE 125*  --   BUN 12  --   CREATININE 0.93 0.90  CALCIUM 9.1  --    Liver Function Tests No results for input(s): AST, ALT, ALKPHOS, BILITOT, PROT, ALBUMIN in the last 72 hours. No results for input(s): LIPASE, AMYLASE in the last 72 hours. Cardiac Enzymes No results for input(s): CKTOTAL, CKMB, CKMBINDEX, TROPONINI in the last 72 hours. BNP Invalid input(s): POCBNP D-Dimer No results for input(s): DDIMER in the last 72  hours. Hemoglobin A1C No results for input(s): HGBA1C in the last 72 hours. Fasting Lipid Panel Recent Labs    07/30/19 0653  CHOL 202*  HDL 28*  LDLCALC 119*  TRIG 273*  CHOLHDL 7.2   Thyroid Function Tests No results for input(s): TSH, T4TOTAL, T3FREE, THYROIDAB in the last 72 hours.  Invalid input(s): FREET3 _____________  DG Chest 2 View  Result Date: 07/29/2019 CLINICAL DATA:  Chest pain EXAM: CHEST - 2 VIEW COMPARISON:  2019 FINDINGS: The heart size and mediastinal contours are within normal limits. Both lungs are clear. No pleural effusion or pneumothorax. The visualized skeletal structures are unremarkable. IMPRESSION: No acute process in the chest Electronically Signed   By: Macy Mis M.D.   On: 07/29/2019 14:34   CARDIAC CATHETERIZATION  Result Date: 07/30/2019 1.  Normal coronary arteries. 2.  Left ventricular angiography was not performed.  EF was normal by echo.  Mildly elevated left ventricular end-diastolic pressure. Recommendations: Noncardiac chest pain. Possible GI etiology.  ECHOCARDIOGRAM COMPLETE  Result Date: 07/30/2019    ECHOCARDIOGRAM REPORT   Patient Name:   Mitchell Adams Date of Exam: 07/30/2019 Medical Rec #:  [id number]       Height:       67.0 in Accession #:    [id number]      Weight:       200.0 lb Date of Birth:  1960/12/09        BSA:          2.022 m Patient Age:    65 years        BP:           125/94 mmHg Patient Gender: M               HR:           57 bpm. Exam Location:  Inpatient Procedure: 2D Echo Indications:    Chest Pain 786.50 / R07.9  History:        Patient has no prior history of Echocardiogram examinations.                 Signs/Symptoms:Chest Pain.  Sonographer:    Vikki Ports Turrentine Referring Phys: [id number] [place]  1. Left ventricular ejection fraction, by estimation, is 60 to 65%. The left ventricle has normal function. The left ventricle has no regional wall motion abnormalities. There is moderate left ventricular  hypertrophy. Left ventricular diastolic parameters were normal.  2. Right ventricular systolic function is normal. The right ventricular size is normal.  3. The mitral valve is grossly normal. Trivial mitral valve regurgitation.  4. The aortic valve is tricuspid. Aortic valve regurgitation is not visualized.  5. The inferior vena cava is normal in size with greater than 50% respiratory variability, suggesting right atrial pressure of 3 mmHg. Conclusion(s)/Recommendation(s): Otherwise normal echocardiogram, with minor abnormalities described in the report. FINDINGS  Left Ventricle: Left ventricular  ejection fraction, by estimation, is 60 to 65%. The left ventricle has normal function. The left ventricle has no regional wall motion abnormalities. The left ventricular internal cavity size was normal in size. There is  moderate left ventricular hypertrophy. Left ventricular diastolic parameters were normal. Right Ventricle: The right ventricular size is normal. No increase in right ventricular wall thickness. Right ventricular systolic function is normal. Left Atrium: Left atrial size was normal in size. Right Atrium: Right atrial size was normal in size. Pericardium: Trivial pericardial effusion is present. The pericardial effusion is posterior to the left ventricle. Mitral Valve: The mitral valve is grossly normal. Trivial mitral valve regurgitation. Tricuspid Valve: The tricuspid valve is grossly normal. Tricuspid valve regurgitation is trivial. Aortic Valve: The aortic valve is tricuspid. Aortic valve regurgitation is not visualized. Pulmonic Valve: The pulmonic valve was grossly normal. Pulmonic valve regurgitation is trivial. Aorta: The aortic root and ascending aorta are structurally normal, with no evidence of dilitation. Venous: The inferior vena cava is normal in size with greater than 50% respiratory variability, suggesting right atrial pressure of 3 mmHg. IAS/Shunts: No atrial level shunt detected by color  flow Doppler.  LEFT VENTRICLE PLAX 2D LVIDd:         4.30 cm  Diastology LVIDs:         3.00 cm  LV e' lateral:   13.50 cm/s LV PW:         1.20 cm  LV E/e' lateral: 4.7 LV IVS:        1.20 cm  LV e' medial:    10.90 cm/s LVOT diam:     2.00 cm  LV E/e' medial:  5.8 LV SV:         74 LV SV Index:   37 LVOT Area:     3.14 cm  RIGHT VENTRICLE RV S prime:     11.10 cm/s TAPSE (M-mode): 1.4 cm LEFT ATRIUM             Index       RIGHT ATRIUM           Index LA Vol (A2C):   42.9 ml 21.21 ml/m RA Area:     13.00 cm LA Vol (A4C):   51.8 ml 25.62 ml/m RA Volume:   30.80 ml  15.23 ml/m LA Biplane Vol: 48.8 ml 24.13 ml/m  AORTIC VALVE LVOT Vmax:   104.00 cm/s LVOT Vmean:  69.200 cm/s LVOT VTI:    0.236 m  AORTA Ao Root diam: 3.10 cm MITRAL VALVE MV Area (PHT): 2.76 cm    SHUNTS MV Decel Time: 275 msec    Systemic VTI:  0.24 m MV E velocity: 63.30 cm/s  Systemic Diam: 2.00 cm MV A velocity: 48.30 cm/s MV E/A ratio:  1.31 Lyman Bishop MD Electronically signed by Lyman Bishop MD Signature Date/Time: 07/30/2019/10:30:04 AM    Final    Disposition   Pt is being discharged home today in good condition.  Follow-up Plans & Appointments    Follow-up Information    [facility]   Specialty: Family Medicine Why: Please follow up regarding further management of your cholesterol.  Contact information: [address] [place] [id number] [phone]             Discharge Medications     Medication List    STOP taking these medications   guaiFENesin-codeine 100-10 MG/5ML syrup   ondansetron 4 MG disintegrating tablet Commonly known as:  Zofran ODT       No                               Did the patient have a percutaneous coronary intervention (stent / angioplasty)?:  No.      Outstanding Labs/Studies   N/a   Duration of Discharge Encounter   Greater than 30 minutes including physician time.  Signed, Reino Bellis  NP-C 07/31/2019, 12:58 PM

## 2019-07-30 NOTE — Discharge Instructions (Signed)
Radial Site Care  This sheet gives you information about how to care for yourself after your procedure. Your health care provider may also give you more specific instructions. If you have problems or questions, contact your health care provider. What can I expect after the procedure? After the procedure, it is common to have:  Bruising and tenderness at the catheter insertion area. Follow these instructions at home: Medicines  Take over-the-counter and prescription medicines only as told by your health care provider. Insertion site care  Follow instructions from your health care provider about how to take care of your insertion site. Make sure you: ? Wash your hands with soap and water before you change your bandage (dressing). If soap and water are not available, use hand sanitizer. ? Change your dressing as told by your health care provider. ? Leave stitches (sutures), skin glue, or adhesive strips in place. These skin closures may need to stay in place for 2 weeks or longer. If adhesive strip edges start to loosen and curl up, you may trim the loose edges. Do not remove adhesive strips completely unless your health care provider tells you to do that.  Check your insertion site every day for signs of infection. Check for: ? Redness, swelling, or pain. ? Fluid or blood. ? Pus or a bad smell. ? Warmth.  Do not take baths, swim, or use a hot tub until your health care provider approves.  You may shower 24-48 hours after the procedure, or as directed by your health care provider. ? Remove the dressing and gently wash the site with plain soap and water. ? Pat the area dry with a clean towel. ? Do not rub the site. That could cause bleeding.  Do not apply powder or lotion to the site. Activity   For 24 hours after the procedure, or as directed by your health care provider: ? Do not flex or bend the affected arm. ? Do not push or pull heavy objects with the affected arm. ? Do not  drive yourself home from the hospital or clinic. You may drive 24 hours after the procedure unless your health care provider tells you not to. ? Do not operate machinery or power tools.  Do not lift anything that is heavier than 10 lb (4.5 kg), or the limit that you are told, until your health care provider says that it is safe.  Ask your health care provider when it is okay to: ? Return to work or school. ? Resume usual physical activities or sports. ? Resume sexual activity. General instructions  If the catheter site starts to bleed, raise your arm and put firm pressure on the site. If the bleeding does not stop, get help right away. This is a medical emergency.  If you went home on the same day as your procedure, a responsible adult should be with you for the first 24 hours after you arrive home.  Keep all follow-up visits as told by your health care provider. This is important. Contact a health care provider if:  You have a fever.  You have redness, swelling, or yellow drainage around your insertion site. Get help right away if:  You have unusual pain at the radial site.  The catheter insertion area swells very fast.  The insertion area is bleeding, and the bleeding does not stop when you hold steady pressure on the area.  Your arm or hand becomes pale, cool, tingly, or numb. These symptoms may represent a serious problem   that is an emergency. Do not wait to see if the symptoms will go away. Get medical help right away. Call your local emergency services (911 in the [place] Do not drive yourself to the hospital. Summary  After the procedure, it is common to have bruising and tenderness at the site.  Follow instructions from your health care provider about how to take care of your radial site wound. Check the wound every day for signs of infection.  Do not lift anything that is heavier than 10 lb (4.5 kg), or the limit that you are told, until your health care provider says  that it is safe. This information is not intended to replace advice given to you by your health care provider. Make sure you discuss any questions you have with your health care provider. Document Revised: 05/16/2017 Document Reviewed: 05/16/2017 Elsevier Patient Education  2020 [employer]  

## 2019-07-30 NOTE — Interval H&P Note (Signed)
History and Physical Interval Note:  07/30/2019 3:05 PM  Mitchell Adams  has presented today for surgery, with the diagnosis of unstable angina.  The various methods of treatment have been discussed with the patient and family. After consideration of risks, benefits and other options for treatment, the patient has consented to  Procedure(s): LEFT HEART CATH AND CORONARY ANGIOGRAPHY (N/A) as a surgical intervention.  The patient's history has been reviewed, patient examined, no change in status, stable for surgery.  I have reviewed the patient's chart and labs.  Questions were answered to the patient's satisfaction.     Kathlyn [place]

## 2019-07-30 NOTE — H&P (Signed)
Cardiology Admission History and Physical:   Patient ID: Mitchell Adams; MRN: [id number]; DOB: 18-Sep-1960   Admission date: 07/29/2019  Primary Care Provider:  [facility] Primary Cardiologist:  No primary care provider on file.   Chief Complaint:    Chest pain  History of Present Illness:   Mitchell Adams is a 59 y.o. male with a history of stage IV prostate CA, hyperlipidemia, abdominal hernia and GERD who presents to the hospital with complaints of substernal chest pain.  He described the pain as left-sided, pressure-like and radiating to the left arm.  He also had associated dyspnea, nausea and vomiting.  He at least had 3 episodes of emesis.  At one point he was also sweating profusely.  The pain lasted for greater than 1 hour.  He was given aspirin 324 mg and 4 doses of sublingual nitroglycerin by EMS.  He had only partial improvement in his chest pain with the nitrates.  Hours after the initial onset of the chest pain he still continued to complain of 2/10 chest pain in the emergency department at [facility].  In the ED the blood pressure was 116/95 mmHg.  The heart rate was 55 bpm.  The ECG showed normal sinus rhythm with a ventricular rate of 82 bpm.  There was T wave flattening in the lateral leads.  There was voltage criteria for LVH.  The chest x-ray was normal.   Past Medical History:  Diagnosis Date  . Cancer [facility])    Stage 4 prostate  . GERD (gastroesophageal reflux disease)    diagnosed with - +yes- GERD, occas. use of chewable antacid   . Hernia, umbilical   . Vertigo     Past Surgical History:  Procedure Laterality Date  . ABDOMINAL EXPLORATION SURGERY  04/29/2015   w/LOA  . ABDOMINAL EXPLORATION SURGERY  2014   S/P colonoscopy w/multiple polyps removed/notes 04/29/2015  . ABDOMINAL SURGERY  2014   due to puncture of bowel after removal of polyps   . APPENDECTOMY  ~ 1987  . COLON SURGERY    . COLONOSCOPY W/ POLYPECTOMY   2014  . HERNIA REPAIR    . INCISIONAL HERNIA REPAIR  04/29/2015  . INCISIONAL HERNIA REPAIR N/A 04/29/2015   Procedure: OPEN INCISIONAL HERNIA REPAIR WITH MESH WITH MYOFACIAL RELEASE;  Surgeon: Rolm Bookbinder, MD;  Location: [place];  Service: General;  Laterality: N/A;  . KNEE ARTHROSCOPY Right 02/09/2016   Procedure: ARTHROSCOPY KNEE;  Surgeon: Frederik Pear, MD;  Location: [place];  Service: Orthopedics;  Laterality: Right;  ARTHROSCOPY KNEE, medial meniscal tear, chondromalacia     Medications Prior to Admission: Prior to Admission medications   Medication Sig Start Date End Date Taking? Authorizing Provider  guaiFENesin-codeine 100-10 MG/5ML syrup Take 10 mLs by mouth 3 (three) times daily as needed for cough. Patient not taking: Reported on 07/29/2019 01/04/18   Jacqlyn Larsen, PA-C  ondansetron (ZOFRAN ODT) 4 MG disintegrating tablet 4mg  ODT q4 hours prn nausea/vomit Patient not taking: Reported on 07/29/2019 01/04/18   Jacqlyn Larsen, PA-C     Allergies:    Allergies  Allergen Reactions  . Penicillins Hives and Other (See Comments)    Has patient had a PCN reaction causing immediate rash, facial/tongue/throat swelling, SOB or lightheadedness with hypotension: Yes Has patient had a PCN reaction causing severe rash involving mucus membranes or skin necrosis: No Has patient had a PCN reaction that required hospitalization No Pt. Reports increased heart rate  &  faint feeling  Has patient had a PCN reaction occurring within the last 10 years: No If all of the above answers are "NO", then may proceed with Cephalosporin use.     Social History:   Social History   Socioeconomic History  . Marital status: Married    Spouse name: Not on file  . Number of children: Not on file  . Years of education: Not on file  . Highest education level: Not on file  Occupational History  . Not on file  Tobacco Use  . Smoking status: Former Smoker    Packs/day: 0.05    Years: 43.00     Pack years: 2.15    Types: Cigarettes    Quit date: 06/22/2014    Years since quitting: 5.1  . Smokeless tobacco: Never Used  Substance and Sexual Activity  . Alcohol use: No  . Drug use: No  . Sexual activity: Not on file  Other Topics Concern  . Not on file  Social History Narrative  . Not on file   Social Determinants of Health   Financial Resource Strain:   . Difficulty of Paying Living Expenses:   Food Insecurity:   . Worried About Charity fundraiser in the Last Year:   . Arboriculturist in the Last Year:   Transportation Needs:   . Film/video editor (Medical):   Marland Kitchen Lack of Transportation (Non-Medical):   Physical Activity:   . Days of Exercise per Week:   . Minutes of Exercise per Session:   Stress:   . Feeling of Stress :   Social Connections:   . Frequency of Communication with Friends and Family:   . Frequency of Social Gatherings with Friends and Family:   . Attends Religious Services:   . Active Member of Clubs or Organizations:   . Attends Archivist Meetings:   Marland Kitchen Marital Status:   Intimate Partner Violence:   . Fear of Current or Ex-Partner:   . Emotionally Abused:   Marland Kitchen Physically Abused:   . Sexually Abused:      Family History:   The patient's family history includes Diabetes in his maternal grandfather and mother; Heart disease in his sister.     Review of Systems: [y] = yes, [ ]  = no   . General: Weight gain [ ] ; Weight loss [ ] ; Anorexia [ ] ; Fatigue [ ] ; Fever [ ] ; Chills [ ] ; Weakness [ ]   . Cardiac: Chest pain/pressure [Y]; Resting SOB [ ] ; Exertional SOB [ ] ; Orthopnea [ ] ; Pedal Edema [ ] ; Palpitations [ ] ; Syncope [ ] ; Presyncope [ ] ; Paroxysmal nocturnal dyspnea[ ]   . Pulmonary: Cough [ ] ; Wheezing[ ] ; Hemoptysis[ ] ; Sputum [ ] ; Snoring [ ]   . GI: Vomiting[ ] ; Dysphagia[ ] ; Melena[ ] ; Hematochezia [ ] ; Heartburn[ ] ; Abdominal pain [ ] ; Constipation [ ] ; Diarrhea [ ] ; BRBPR [ ]   . GU: Hematuria[ ] ; Dysuria [ ] ; Nocturia[ ]    . Vascular: Pain in legs with walking [ ] ; Pain in feet with lying flat [ ] ; Non-healing sores [ ] ; Stroke [ ] ; TIA [ ] ; Slurred speech [ ] ;  . Neuro: Headaches[ ] ; Vertigo[ ] ; Seizures[ ] ; Paresthesias[ ] ;Blurred vision [ ] ; Diplopia [ ] ; Vision changes [ ]   . Ortho/Skin: Arthritis [ ] ; Joint pain [ ] ; Muscle pain [ ] ; Joint swelling [ ] ; Back Pain [ ] ; Rash [ ]   . Psych: Depression[ ] ; Anxiety[ ]   . Heme: Bleeding problems [ ] ;  Clotting disorders [ ] ; Anemia [ ]   . Endocrine: Diabetes [ ] ; Thyroid dysfunction[ ]      Physical Exam/Data:   Vitals:   07/29/19 2057 07/29/19 2158 07/29/19 2330 07/29/19 2345  BP: 107/72 121/81 (!) 116/95 (!) 126/109  Pulse: (!) 58 (!) 55 (!) 55 (!) 58  Resp: 20 15 19 13   Temp: 98 F (36.7 C) 97.7 F (36.5 C)    TempSrc: Oral Oral    SpO2: 97% 99% 99% 98%  Weight:      Height:       No intake or output data in the 24 hours ending 07/30/19 0022 Filed Weights   07/29/19 1405  Weight: 90.7 kg   Body mass index is 31.32 kg/m.  General:  Well nourished, well developed, in no acute distress HEENT: normal Lymph: no adenopathy Neck: no JVD Endocrine:  No thryomegaly Vascular: No carotid bruits; FA pulses 2+ bilaterally without bruits  Cardiac:  normal S1, S2; RRR; no murmur Lungs:  clear to auscultation bilaterally, no wheezing, rhonchi or rales  Abd: soft, nontender, no hepatomegaly  Ext: no edema Musculoskeletal:  No deformities, BUE and BLE strength normal and equal Skin: warm and dry  Neuro:  CNs 2-12 intact, no focal abnormalities noted Psych:  Normal affect    EKG:  The ECG that I reviewed personally showed normal sinus rhythm with a ventricular rate of 82 bpm.  There was T wave flattening in the lateral leads.  There was voltage criteria for LVH.  Laboratory Data:  Chemistry Recent Labs  Lab 07/29/19 1421  NA 140  K 4.0  CL 106  CO2 25  GLUCOSE 125*  BUN 12  CREATININE 0.93  CALCIUM 9.1  GFRNONAA >60  GFRAA >60  ANIONGAP 9     No results for input(s): PROT, ALBUMIN, AST, ALT, ALKPHOS, BILITOT in the last 168 hours. Hematology Recent Labs  Lab 07/29/19 1421  WBC 5.2  RBC 4.14*  HGB 13.1  HCT 40.3  MCV 97.3  MCH 31.6  MCHC 32.5  RDW 11.7  PLT 243   Cardiac EnzymesNo results for input(s): TROPONINI in the last 168 hours. No results for input(s): TROPIPOC in the last 168 hours.  BNPNo results for input(s): BNP, PROBNP in the last 168 hours.  DDimer No results for input(s): DDIMER in the last 168 hours.  Radiology/Studies:  DG Chest 2 View  Result Date: 07/29/2019 CLINICAL DATA:  Chest pain EXAM: CHEST - 2 VIEW COMPARISON:  2019 FINDINGS: The heart size and mediastinal contours are within normal limits. Both lungs are clear. No pleural effusion or pneumothorax. The visualized skeletal structures are unremarkable. IMPRESSION: No acute process in the chest Electronically Signed   By: Macy Mis M.D.   On: 07/29/2019 14:34    Assessment and Plan:   1. Unstable angina The patient had acute onset of chest pain yesterday.  The pain had both typical as well as atypical features.  He described the chest pain as occurring over the substernal area with radiation to the left arm.  He also had some associated nausea and vomited at least 3 times.  He had only partial relief with nitroglycerin.  The chest pain lasted for more than 1 hour.  In the hospital his ECG only showed nonspecific T wave abnormalities.  The high-sensitivity troponins were negative.  -Admit to monitored bed -Serial ECGs -Continue to trend cardiac biomarkers -Aspirin 81 mg daily -Check lipid panel -Consider high-dose statins -We will hold off on IV heparin  since the initial two troponins are negative -Nitroglycerin as needed for chest pain -Transthoracic echocardiogram to evaluate LV function -Keep NPO  Patient will likely require an ischemic work-up in the morning.  Depending on his pretest likelihood of CAD can decide between stress  testing versus a cardiac catheterization procedure.   Severity of Illness: The appropriate patient status for this patient is INPATIENT. Inpatient status is judged to be reasonable and necessary in order to provide the required intensity of service to ensure the patient's safety. The patient's presenting symptoms, physical exam findings, and initial radiographic and laboratory data in the context of their chronic comorbidities is felt to place them at high risk for further clinical deterioration. Furthermore, it is not anticipated that the patient will be medically stable for discharge from the hospital within 2 midnights of admission. The following factors support the patient status of inpatient.   " The patient's presenting symptoms include chest pain. " The worrisome physical exam findings include NA. " The initial radiographic and laboratory data are worrisome because of nonspecific changes on ECG. " The chronic co-morbidities include NA.   * I certify that at the point of admission it is my clinical judgment that the patient will require inpatient hospital care spanning beyond 2 midnights from the point of admission due to high intensity of service, high risk for further deterioration and high frequency of surveillance required.*    For questions or updates, please contact [place] Please consult [email or web address] for contact info under Cardiology/STEMI.    Signed, Meade Maw, MD  07/30/2019 12:22 AM

## 2019-07-30 NOTE — Progress Notes (Signed)
Cardiology follow-up note: I have reviewed all of the available data on Mitchell Adams.  He complains of some residual soreness in his chest.  No other complaints at present.  His EKG is nondiagnostic.  High-sensitivity troponins are negative x2.  Considering his prolonged episode of discomfort yesterday and background risk factors of prior tobacco use, strong family history of coronary artery disease, hypertension, we have elected to proceed with definitive evaluation with cardiac catheterization and possible PCI.  We discussed options of noninvasive testing versus diagnostic catheterization and possible angioplasty and stenting.  The patient and his wife both favor a definitive evaluation.  I reviewed the risks, indications, potential benefits, and alternatives extensively with him today. I have reviewed the risks, indications, and alternatives to cardiac catheterization, possible angioplasty, and stenting with the patient. Risks include but are not limited to bleeding, infection, vascular injury, stroke, myocardial infection, arrhythmia, kidney injury, radiation-related injury in the case of prolonged fluoroscopy use, emergency cardiac surgery, and death. The patient understands the risks of serious complication is 1-2 in 123XX123 with diagnostic cardiac cath and 1-2% or less with angioplasty/stenting.   AUC Angina Class: 4 Anti-anginal Rx: none ACS: yes Stress Test: none Prior CABG: none  Mitchell Adams, M.D. 07/30/2019 11:10 AM

## 2019-07-30 NOTE — Progress Notes (Signed)
  Echocardiogram 2D Echocardiogram has been performed.  Mitchell Adams 07/30/2019, 8:56 AM

## 2019-07-30 NOTE — H&P (View-Only) (Signed)
Cardiology follow-up note: I have reviewed all of the available data on Mitchell Adams.  He complains of some residual soreness in his chest.  No other complaints at present.  His EKG is nondiagnostic.  High-sensitivity troponins are negative x2.  Considering his prolonged episode of discomfort yesterday and background risk factors of prior tobacco use, strong family history of coronary artery disease, hypertension, we have elected to proceed with definitive evaluation with cardiac catheterization and possible PCI.  We discussed options of noninvasive testing versus diagnostic catheterization and possible angioplasty and stenting.  The patient and his wife both favor a definitive evaluation.  I reviewed the risks, indications, potential benefits, and alternatives extensively with him today. I have reviewed the risks, indications, and alternatives to cardiac catheterization, possible angioplasty, and stenting with the patient. Risks include but are not limited to bleeding, infection, vascular injury, stroke, myocardial infection, arrhythmia, kidney injury, radiation-related injury in the case of prolonged fluoroscopy use, emergency cardiac surgery, and death. The patient understands the risks of serious complication is 1-2 in 123XX123 with diagnostic cardiac cath and 1-2% or less with angioplasty/stenting.   AUC Angina Class: 4 Anti-anginal Rx: none ACS: yes Stress Test: none Prior CABG: none  Sherren Mocha, M.D. 07/30/2019 11:10 AM

## 2021-06-24 ENCOUNTER — Emergency Department (HOSPITAL_BASED_OUTPATIENT_CLINIC_OR_DEPARTMENT_OTHER): Payer: No Typology Code available for payment source

## 2021-06-24 ENCOUNTER — Encounter (HOSPITAL_BASED_OUTPATIENT_CLINIC_OR_DEPARTMENT_OTHER): Payer: Self-pay | Admitting: Emergency Medicine

## 2021-06-24 ENCOUNTER — Emergency Department (HOSPITAL_BASED_OUTPATIENT_CLINIC_OR_DEPARTMENT_OTHER)
Admission: EM | Admit: 2021-06-24 | Discharge: 2021-06-24 | Disposition: A | Discharge status: Home / Self Care | Payer: No Typology Code available for payment source | Attending: Emergency Medicine | Admitting: Emergency Medicine

## 2021-06-24 ENCOUNTER — Other Ambulatory Visit: Payer: Self-pay

## 2021-06-24 DIAGNOSIS — G8929 Other chronic pain: Secondary | ICD-10-CM

## 2021-06-24 DIAGNOSIS — M5441 Lumbago with sciatica, right side: Secondary | ICD-10-CM | POA: Insufficient documentation

## 2021-06-24 DIAGNOSIS — M5136 Other intervertebral disc degeneration, lumbar region: Secondary | ICD-10-CM | POA: Diagnosis not present

## 2021-06-24 DIAGNOSIS — M25561 Pain in right knee: Secondary | ICD-10-CM | POA: Insufficient documentation

## 2021-06-24 DIAGNOSIS — M51369 Other intervertebral disc degeneration, lumbar region without mention of lumbar back pain or lower extremity pain: Secondary | ICD-10-CM

## 2021-06-24 DIAGNOSIS — M5431 Sciatica, right side: Secondary | ICD-10-CM

## 2021-06-24 DIAGNOSIS — M545 Low back pain, unspecified: Secondary | ICD-10-CM | POA: Diagnosis present

## 2021-06-24 MED ORDER — PREDNISONE 50 MG PO TABS
60.0000 mg | ORAL_TABLET | Freq: Once | ORAL | Status: AC
  Administered 2021-06-24: 60 mg via ORAL
  Filled 2021-06-24: qty 1

## 2021-06-24 MED ORDER — ACETAMINOPHEN 500 MG PO TABS
1000.0000 mg | ORAL_TABLET | Freq: Once | ORAL | Status: AC
  Administered 2021-06-24: 1000 mg via ORAL
  Filled 2021-06-24: qty 2

## 2021-06-24 MED ORDER — METHOCARBAMOL 750 MG PO TABS: 750.0000 mg | ORAL_TABLET | Freq: Three times a day (TID) | ORAL | 0 refills | Status: DC | PRN

## 2021-06-24 MED ORDER — PREDNISONE 20 MG PO TABS: ORAL_TABLET | ORAL | 0 refills | Status: DC

## 2021-06-24 NOTE — ED Triage Notes (Signed)
Pt arrives pov with c/o right knee pain and lower back back pain, hx of sciatica, denies recent injury. Denies otc meds pta ?

## 2021-06-24 NOTE — Discharge Instructions (Signed)
It was our pleasure to provide your ER care today - we hope that you feel better. ? ?Take prednisone as prescribed. Take acetaminophen as need for pain. You may also try robaxin as need for muscle pain/spasm - no driving when taking.  ? ?Follow up with your doctor in 1-2 weeks if symptoms fail to improve/resolve. ? ?Return to ER if worse, new symptoms, numbness/weakness, intractable pain, fevers, or other concern.  ?

## 2021-06-24 NOTE — ED Notes (Signed)
Discharge instructions discussed with pt. Pt verbalized understanding. Pt stable and ambulatory.  °

## 2021-06-24 NOTE — ED Notes (Signed)
ED Provider at bedside. 

## 2021-06-24 NOTE — ED Provider Notes (Signed)
?[facility] EMERGENCY DEPARTMENT ?Provider Note ? ? ?CSN: [id number] ?Arrival date & time: 06/24/21  1015 ? ?  ? ?History ? ?Chief Complaint  ?Patient presents with  ? Back Pain  ? ? ?Mitchell Adams is a 61 y.o. male. ? ?Patient c/o right low back pain radiating down posterior/lat right leg in past 1-2 weeks. Also c/o right knee pain. No hx prior back surgery or ddd. No recent trauma, fall, injury, or strain. 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 No leg swelling. No skin changes, rash, redness or lesions. No fever or chills. Denies leg numbness or weakness. No saddle area numbness. No problems w normal bowel/bladder fxn.  ? ?The history is provided by the patient and medical records.  ?Back Pain ?Associated symptoms: no abdominal pain, no chest pain, no fever, no headaches, no numbness and no weakness   ? ?  ? ?Home Medications ?Prior to Admission medications   ?Not on File  ?   ? ?Allergies    ?Penicillins   ? ?Review of Systems   ?Review of Systems  ?Constitutional:  Negative for fever.  ?HENT:  Negative for sore throat.   ?Respiratory:  Negative for shortness of breath.   ?Cardiovascular:  Negative for chest pain.  ?Gastrointestinal:  Negative for abdominal pain and vomiting.  ?Genitourinary:  Negative for flank pain.  ?Musculoskeletal:  Positive for back pain. Negative for neck pain.  ?Skin:  Negative for rash and wound.  ?Neurological:  Negative for weakness, numbness and headaches.  ?Hematological:  Does not bruise/bleed easily.  ?Psychiatric/Behavioral:  Negative for confusion.   ? ?Physical Exam ?Updated Vital Signs ?BP 139/89   Pulse 79   Temp 98.4 ?F (36.9 ?C) (Oral)   Resp 16   Ht 1.702 m (5\' 7" )   Wt 90.3 kg   SpO2 98%   BMI 31.17 kg/m?  ?Physical Exam ?Vitals and nursing note reviewed.  ?Constitutional:   ?   Appearance: Normal appearance. He is well-developed.  ?HENT:  ?   Head: Atraumatic.  ?   Nose: Nose normal.  ?   Mouth/Throat:  ?   Mouth: Mucous membranes are moist.  ?Eyes:  ?    General: No scleral icterus. ?   Conjunctiva/sclera: Conjunctivae normal.  ?Neck:  ?   Trachea: No tracheal deviation.  ?Cardiovascular:  ?   Rate and Rhythm: Normal rate.  ?   Pulses: Normal pulses.  ?Pulmonary:  ?   Effort: Pulmonary effort is normal. No accessory muscle usage or respiratory distress.  ?Abdominal:  ?   General: There is no distension.  ?   Palpations: Abdomen is soft. There is no mass.  ?   Tenderness: There is no abdominal tenderness. There is no guarding.  ?Genitourinary: ?   Comments: No cva tenderness. ?Musculoskeletal:     ?   General: No swelling or tenderness.  ?   Cervical back: Normal range of motion and neck supple. No rigidity.  ?   Comments: Mild tenderness, pain w active rom right knee. No effusion, no increased warmth to knee, no pain w passive rom. Distal pulses palp. No RLE swelling or edema. Lumbar tenderness, mild. No sts or skin changes to area.   ?Skin: ?   General: Skin is warm and dry.  ?   Findings: No rash.  ?Neurological:  ?   Mental Status: He is alert.  ?   Comments: Alert, speech clear. RLE motor/sens grossly intact. Steady gait.   ?Psychiatric:     ?  Mood and Affect: Mood normal.  ? ? ?ED Results / Procedures / Treatments   ?Labs ?(all labs ordered are listed, but only abnormal results are displayed) ?Labs Reviewed - No data to display ? ?EKG ?None ? ?Radiology ?DG Lumbar Spine Complete ? ?Result Date: 06/24/2021 ?CLINICAL DATA:  Right-sided lower back pain radiating into right leg for 2-3 days. EXAM: LUMBAR SPINE - COMPLETE 4+ VIEW COMPARISON:  CT abdomen and pelvis 03/31/2017 FINDINGS: There are 5 non-rib-bearing lumbar-type vertebral bodies. Normal frontal alignment. No sagittal spondylolisthesis. Vertebral body heights are maintained. Minimal T12-L1 and L5-S1 posterior disc space narrowing. Mild L5-S1 facet joint arthropathy. The sacroiliac joints and pubic symphysis joint spaces are maintained. Surgical clips overlie the right and left pubic bodies localized to  the prostate on prior CT. IMPRESSION: Minimal degenerative disc changes of the lumbar spine. Electronically Signed   By: Yvonne Kendall M.D.   On: 06/24/2021 11:02  ? ?DG Knee Complete 4 Views Right ? ?Result Date: 06/24/2021 ?CLINICAL DATA:  Right knee pain for 2-3 days. EXAM: RIGHT KNEE - COMPLETE 4+ VIEW COMPARISON:  None. FINDINGS: Minimal medial compartment joint space narrowing and peripheral osteophytosis degenerative change. Mild-to-moderate superior patellar degenerative osteophytosis. Likely mild patellofemoral joint space narrowing. No joint effusion. Developmental variant bipartite patella with tiny associated superolateral ossicle. No acute fracture or dislocation. 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 Electronically Signed   By: Yvonne Kendall M.D.   On: 06/24/2021 11:00   ? ?Procedures ?Procedures  ? ? ?Medications Ordered in ED ?Medications  ?predniSONE (DELTASONE) tablet 60 mg (has no administration in time range)  ?acetaminophen (TYLENOL) tablet 1,000 mg (has no administration in time range)  ? ? ?ED Course/ Medical Decision Making/ A&P ?  ?                        ?Medical Decision Making ?Problems Addressed: ?Chronic pain of right knee: chronic illness or injury with exacerbation, progression, or side effects of treatment ?Lumbar degenerative disc disease: acute illness or injury ?Sciatica, right side: acute illness or injury ? ?Amount and/or Complexity of Data Reviewed ?External Data Reviewed: notes. ?Radiology: ordered and independent interpretation performed. Decision-making details documented in ED Course. ? ?Risk ?OTC drugs. ?Prescription drug management. ? ?Imaging ordered. ? ?Reviewed nursing notes and prior charts for additional history. External reports reviewed. ? ?Xrays reviewed/interpreted by me - no fx. Chr deg changes.  ? ?No meds today or pta. Prednisone po. Acetaminophen po. ? ?Rx for  home. ? ?Rec pcp f/u. ? ?Return precautions provided.  ? ? ? ? ? ? ? ? ? ?Final Clinical Impression(s) / ED Diagnoses ?Final diagnoses:  ?None  ? ? ?Rx / DC Orders ?ED Discharge Orders   ? ? None  ? ?  ? ? ?  ?Lajean Saver, MD ?06/24/21 1117 ? ?

## 2022-03-30 ENCOUNTER — Emergency Department (HOSPITAL_BASED_OUTPATIENT_CLINIC_OR_DEPARTMENT_OTHER): Payer: No Typology Code available for payment source

## 2022-03-30 ENCOUNTER — Other Ambulatory Visit: Payer: Self-pay

## 2022-03-30 ENCOUNTER — Emergency Department (HOSPITAL_BASED_OUTPATIENT_CLINIC_OR_DEPARTMENT_OTHER)
Admission: EM | Admit: 2022-03-30 | Discharge: 2022-03-31 | Disposition: A | Discharge status: Home / Self Care | Payer: No Typology Code available for payment source | Attending: Emergency Medicine | Admitting: Emergency Medicine

## 2022-03-30 ENCOUNTER — Encounter (HOSPITAL_BASED_OUTPATIENT_CLINIC_OR_DEPARTMENT_OTHER): Payer: Self-pay

## 2022-03-30 DIAGNOSIS — Z1152 Encounter for screening for COVID-19: Secondary | ICD-10-CM | POA: Diagnosis not present

## 2022-03-30 DIAGNOSIS — Z87891 Personal history of nicotine dependence: Secondary | ICD-10-CM | POA: Diagnosis not present

## 2022-03-30 DIAGNOSIS — J101 Influenza due to other identified influenza virus with other respiratory manifestations: Secondary | ICD-10-CM | POA: Insufficient documentation

## 2022-03-30 DIAGNOSIS — Z8546 Personal history of malignant neoplasm of prostate: Secondary | ICD-10-CM | POA: Insufficient documentation

## 2022-03-30 DIAGNOSIS — R0602 Shortness of breath: Secondary | ICD-10-CM | POA: Diagnosis present

## 2022-03-30 LAB — COMPREHENSIVE METABOLIC PANEL
ALT: 29 U/L (ref 0–44)
AST: 27 U/L (ref 15–41)
Albumin: 4.2 g/dL (ref 3.5–5.0)
Alkaline Phosphatase: 57 U/L (ref 38–126)
Anion gap: 7 (ref 5–15)
BUN: 20 mg/dL (ref 6–20)
CO2: 27 mmol/L (ref 22–32)
Calcium: 8.6 mg/dL — ABNORMAL LOW (ref 8.9–10.3)
Chloride: 105 mmol/L (ref 98–111)
Creatinine, Ser: 0.98 mg/dL (ref 0.61–1.24)
GFR, Estimated: >60 mL/min (ref >60)
Glucose, Bld: 115 mg/dL — ABNORMAL HIGH (ref 70–99)
Potassium: 3.7 mmol/L (ref 3.5–5.1)
Sodium: 139 mmol/L (ref 135–145)
Total Bilirubin: 0.8 mg/dL (ref 0.3–1.2)
Total Protein: 7 g/dL (ref 6.5–8.1)

## 2022-03-30 LAB — CBC
HCT: 38.3 % — ABNORMAL LOW (ref 39.0–52.0)
Hemoglobin: 12.6 g/dL — ABNORMAL LOW (ref 13.0–17.0)
MCH: 31 pg (ref 26.0–34.0)
MCHC: 32.9 g/dL (ref 30.0–36.0)
MCV: 94.3 fL (ref 80.0–100.0)
Platelets: 205 10*3/uL (ref 150–400)
RBC: 4.06 MIL/uL — ABNORMAL LOW (ref 4.22–5.81)
RDW: 12.1 % (ref 11.5–15.5)
WBC: 4.8 10*3/uL (ref 4.0–10.5)
nRBC: 0 % (ref 0.0–0.2)

## 2022-03-30 LAB — RESP PANEL BY RT-PCR (FLU A&B, COVID) ARPGX2
Influenza A by PCR: POSITIVE — AB
Influenza B by PCR: NEGATIVE
SARS Coronavirus 2 by RT PCR: NEGATIVE

## 2022-03-30 MED ORDER — IPRATROPIUM-ALBUTEROL 0.5-2.5 (3) MG/3ML IN SOLN: 3.0000 mL | Freq: Once | RESPIRATORY_TRACT | Status: AC

## 2022-03-30 MED ORDER — IPRATROPIUM-ALBUTEROL 0.5-2.5 (3) MG/3ML IN SOLN
RESPIRATORY_TRACT | Status: AC
  Administered 2022-03-30: 3 mL via RESPIRATORY_TRACT
  Filled 2022-03-30: qty 3

## 2022-03-30 NOTE — ED Triage Notes (Signed)
POV, dry non productive cough, SOB that began yesterday at work and got worse, denies hx of resp/cardiac illness. Pt labored after walking to triage. Sts pain with deep breathing, body aches. Pt amb, alert and oriented x 4.

## 2022-03-30 NOTE — ED Provider Notes (Signed)
[facility] Provider Note: Georgena Spurling, MD, FACEP  CSN: [id number] MRN: [id number] ARRIVAL: 03/30/22 at [place]: [place] [facility]   HISTORY OF PRESENT ILLNESS  03/30/22 10:55 PM Mitchell Adams is a 61 y.o. male with nonproductive cough and shortness of breath began yesterday at work and has gotten worse.  Shortness of breath is worse with exertion.  He is having bodyaches and pain with breathing with this and was noted to have a temperature of 100.1 on arrival.  The respiratory therapist noted he was having inspiratory and expiratory wheezing and she was preparing to initiate a neb treatment as I entered the room.  Past Medical History:  Diagnosis Date   Cancer ([place])    Stage 4 prostate   GERD (gastroesophageal reflux disease)    diagnosed with - +yes- GERD, occas. use of chewable antacid    Hernia, umbilical    Vertigo     Past Surgical History:  Procedure Laterality Date   ABDOMINAL EXPLORATION SURGERY  04/29/2015   w/LOA   ABDOMINAL EXPLORATION SURGERY  2014   S/P colonoscopy w/multiple polyps removed/notes 04/29/2015   ABDOMINAL SURGERY  2014   due to puncture of bowel after removal of polyps    APPENDECTOMY  ~ [place]  2014   [place]  04/29/2015   INCISIONAL HERNIA REPAIR N/A 04/29/2015   Procedure: OPEN INCISIONAL HERNIA REPAIR WITH MESH WITH MYOFACIAL RELEASE;  Surgeon: Rolm Bookbinder, MD;  Location: [facility];  Service: General;  Laterality: N/A;   KNEE ARTHROSCOPY Right 02/09/2016   Procedure: ARTHROSCOPY KNEE;  Surgeon: Frederik Pear, MD;  Location: [facility];  Service: Orthopedics;  Laterality: Right;  ARTHROSCOPY KNEE, medial meniscal tear, chondromalacia   LEFT HEART CATH AND CORONARY ANGIOGRAPHY N/A 07/30/2019   Procedure: LEFT HEART CATH AND CORONARY ANGIOGRAPHY;  Surgeon: Wellington Hampshire, MD;  Location: [facility];  Service: Cardiovascular;  Laterality: N/A;    Family History  Problem Relation Age of Onset   Diabetes Mother    Heart disease Sister    Diabetes Maternal Grandfather     Social History   Tobacco Use   Smoking status: Former    Packs/day: 0.05    Years: 43.00    Total pack years: 2.15    Types: Cigarettes    Quit date: 06/22/2014    Years since quitting: 7.7   Smokeless tobacco: Never  Substance Use Topics   Alcohol use: Yes    Comment: occ   Drug use: No    Prior to Admission medications   Medication Sig Start Date End Date Taking? Authorizing Provider  chlorpheniramine-HYDROcodone (TUSSIONEX) 10-8 MG/5ML Take 5 mLs by mouth every 12 (twelve) hours as needed for cough. 03/31/22  Yes Chip Canepa, MD  oseltamivir (TAMIFLU) 75 MG capsule Take 1 capsule (75 mg total) by mouth every 12 (twelve) hours. 03/31/22  Yes Tory Mckissack, MD    Allergies Penicillins   REVIEW OF SYSTEMS  Negative except as noted here or in the History of Present Illness.   PHYSICAL EXAMINATION  Initial Vital Signs Blood pressure 114/67, pulse 79, temperature 100.1 F (37.8 C), temperature source Oral, resp. rate 20, height '5\' 7"'$  (1.702 m), weight 93 kg, SpO2 96 %.  Examination General: Well-developed, well-nourished male in no acute distress; appearance consistent with age of record HENT: normocephalic;  atraumatic Eyes: Normal appearance Neck: supple Heart: regular rate and rhythm Lungs: Inspiratory and expiratory wheezes with decreased air movement bilaterally Abdomen: soft; nondistended; nontender; bowel sounds present Extremities: No deformity; full range of motion; pulses normal Neurologic: Awake, alert and oriented; motor function intact in all extremities and symmetric; no facial droop Skin: Warm and dry Psychiatric: Normal mood and affect   RESULTS  Summary of this visit's results, reviewed and interpreted by myself:   EKG Interpretation  Date/Time:    Ventricular  Rate:    PR Interval:    QRS Duration:   QT Interval:    QTC Calculation:   R Axis:     Text Interpretation:         Laboratory Studies: Results for orders placed or performed during the hospital encounter of 03/30/22 (from the past 24 hour(s))  Comprehensive metabolic panel     Status: Abnormal   Collection Time: 03/30/22  7:40 PM  Result Value Ref Range   Sodium 139 135 - 145 mmol/L   Potassium 3.7 3.5 - 5.1 mmol/L   Chloride 105 98 - 111 mmol/L   CO2 27 22 - 32 mmol/L   Glucose, Bld 115 (H) 70 - 99 mg/dL   BUN 20 6 - 20 mg/dL   Creatinine, Ser 0.98 0.61 - 1.24 mg/dL   Calcium 8.6 (L) 8.9 - 10.3 mg/dL   Total Protein 7.0 6.5 - 8.1 g/dL   Albumin 4.2 3.5 - 5.0 g/dL   AST 27 15 - 41 U/L   ALT 29 0 - 44 U/L   Alkaline Phosphatase 57 38 - 126 U/L   Total Bilirubin 0.8 0.3 - 1.2 mg/dL   GFR, Estimated >60 >60 mL/min   Anion gap 7 5 - 15  CBC     Status: Abnormal   Collection Time: 03/30/22  7:40 PM  Result Value Ref Range   WBC 4.8 4.0 - 10.5 K/uL   RBC 4.06 (L) 4.22 - 5.81 MIL/uL   Hemoglobin 12.6 (L) 13.0 - 17.0 g/dL   HCT 38.3 (L) 39.0 - 52.0 %   MCV 94.3 80.0 - 100.0 fL   MCH 31.0 26.0 - 34.0 pg   MCHC 32.9 30.0 - 36.0 g/dL   RDW 12.1 11.5 - 15.5 %   Platelets 205 150 - 400 K/uL   nRBC 0.0 0.0 - 0.2 %  Resp Panel by RT-PCR (Flu A&B, Covid) Anterior Nasal Swab     Status: Abnormal   Collection Time: 03/30/22  7:40 PM   Specimen: Anterior Nasal Swab  Result Value Ref Range   SARS Coronavirus 2 by RT PCR NEGATIVE NEGATIVE   Influenza A by PCR POSITIVE (A) NEGATIVE   Influenza B by PCR NEGATIVE NEGATIVE   Imaging Studies: DG Chest Port 1 View  Result Date: 03/30/2022 CLINICAL DATA:  Cough EXAM: PORTABLE CHEST 1 VIEW COMPARISON:  None Available. FINDINGS: No pleural effusion. No pneumothorax. Unchanged cardiac and mediastinal contours. Visualized upper abdomen is unremarkable. Poor visualization of the left hemidiaphragm may be secondary to the presence of left  basilar atelectasis. No displaced rib fractures. Degenerative changes of bilateral AC joints. IMPRESSION: Likely left basilar atelectasis Electronically Signed   By: Marin Roberts M.D.   On: 03/30/2022 20:52    ED COURSE and MDM  Nursing notes, initial and subsequent vitals signs, including pulse oximetry, reviewed and interpreted by myself.  Vitals:   03/30/22 2345 03/31/22 0000 03/31/22 0015 03/31/22 0030  BP: 117/68 107/65 108/66 111/61  Pulse: 72  76 77 83  Resp:      Temp:      TempSrc:      SpO2: 94% (!) 89% 90% 91%  Weight:      Height:       Medications  chlorpheniramine-HYDROcodone (TUSSIONEX) 10-8 MG/5ML suspension 5 mL (has no administration in time range)  aerochamber plus with mask device 1 each (has no administration in time range)  albuterol (VENTOLIN HFA) 108 (90 Base) MCG/ACT inhaler 2 puff (has no administration in time range)  ipratropium-albuterol (DUONEB) 0.5-2.5 (3) MG/3ML nebulizer solution 3 mL (3 mLs Nebulization Given 03/30/22 2259)   12:34 AM Movement improved, wheezing decreased after DuoNeb treatment.  Will now provide him with an albuterol inhaler and AeroChamber and instruct him in their use.  He has used an inhaler in the past but not recently.  We will start him on Tamiflu for his influenza A.   PROCEDURES  Procedures   ED DIAGNOSES     ICD-10-CM   1. Influenza A with respiratory manifestations  J10.1          Hendrixx Severin, MD 03/31/22 [id number]

## 2022-03-31 MED ORDER — HYDROCOD POLI-CHLORPHE POLI ER 10-8 MG/5ML PO SUER
5.0000 mL | Freq: Once | ORAL | Status: AC
  Administered 2022-03-31: 5 mL via ORAL
  Filled 2022-03-31: qty 5

## 2022-03-31 MED ORDER — ALBUTEROL SULFATE HFA 108 (90 BASE) MCG/ACT IN AERS
2.0000 | INHALATION_SPRAY | RESPIRATORY_TRACT | Status: DC | PRN
  Administered 2022-03-31: 2 via RESPIRATORY_TRACT
  Filled 2022-03-31: qty 6.7

## 2022-03-31 MED ORDER — HYDROCOD POLI-CHLORPHE POLI ER 10-8 MG/5ML PO SUER: 5.0000 mL | Freq: Two times a day (BID) | ORAL | 0 refills | Status: AC | PRN

## 2022-03-31 MED ORDER — OSELTAMIVIR PHOSPHATE 75 MG PO CAPS: 75.0000 mg | ORAL_CAPSULE | Freq: Two times a day (BID) | ORAL | 0 refills | Status: AC

## 2022-03-31 MED ORDER — AEROCHAMBER PLUS FLO-VU MISC
1.0000 | Freq: Once | Status: AC
  Administered 2022-03-31: 1
  Filled 2022-03-31: qty 1

## 2022-03-31 NOTE — ED Notes (Signed)
RT educated pt on proper use of MDI w/spacer. Pt able to perform without difficulty. Pt respiratory status stable on RA w/clear/dim BLBS. Pt verbalizes understanding of teaching.

## 2022-04-12 ENCOUNTER — Other Ambulatory Visit: Payer: Self-pay

## 2022-04-12 ENCOUNTER — Emergency Department (HOSPITAL_BASED_OUTPATIENT_CLINIC_OR_DEPARTMENT_OTHER)
Admission: EM | Admit: 2022-04-12 | Discharge: 2022-04-12 | Disposition: A | Discharge status: Home / Self Care | Payer: No Typology Code available for payment source | Attending: Emergency Medicine | Admitting: Emergency Medicine

## 2022-04-12 ENCOUNTER — Emergency Department (HOSPITAL_BASED_OUTPATIENT_CLINIC_OR_DEPARTMENT_OTHER): Payer: No Typology Code available for payment source

## 2022-04-12 ENCOUNTER — Encounter (HOSPITAL_BASED_OUTPATIENT_CLINIC_OR_DEPARTMENT_OTHER): Payer: Self-pay | Admitting: Emergency Medicine

## 2022-04-12 DIAGNOSIS — R052 Subacute cough: Secondary | ICD-10-CM | POA: Diagnosis present

## 2022-04-12 DIAGNOSIS — Z20822 Contact with and (suspected) exposure to covid-19: Secondary | ICD-10-CM | POA: Insufficient documentation

## 2022-04-12 LAB — RESP PANEL BY RT-PCR (RSV, FLU A&B, COVID)  RVPGX2
Influenza A by PCR: NEGATIVE
Influenza B by PCR: NEGATIVE
Resp Syncytial Virus by PCR: NEGATIVE
SARS Coronavirus 2 by RT PCR: NEGATIVE

## 2022-04-12 MED ORDER — BENZONATATE 100 MG PO CAPS: 100.0000 mg | ORAL_CAPSULE | Freq: Three times a day (TID) | ORAL | 0 refills | Status: AC | PRN

## 2022-04-12 NOTE — ED Triage Notes (Signed)
Pt states he just got over the flu about a week ago and has a lingering cough  Pt states he had a coughing spell this morning and has felt short of breath since

## 2022-04-12 NOTE — ED Provider Notes (Signed)
[facility] EMERGENCY DEPARTMENT Provider Note   CSN: [id number] Arrival date & time: 04/12/22  9798     History  Chief Complaint  Patient presents with   Cough    Mitchell Adams is a 61 y.o. male presenting to the ER with cough and chest pain.  The patient was diagnosed with influenza on 03/30/22 in the emergency department.  He reports that his acute flu symptoms improved, but he continues to have a lingering cough.  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  He now denies any chest pain or pressure.  He continues to have a cough.  HPI     Home Medications Prior to Admission medications   Medication Sig Start Date End Date Taking? Authorizing Provider  benzonatate (TESSALON) 100 MG capsule Take 1 capsule (100 mg total) by mouth 3 (three) times daily as needed for up to 21 doses for cough. 04/12/22  Yes Laya Letendre, Carola Rhine, MD  chlorpheniramine-HYDROcodone (TUSSIONEX) 10-8 MG/5ML Take 5 mLs by mouth every 12 (twelve) hours as needed for cough. 03/31/22   Molpus, John, MD  oseltamivir (TAMIFLU) 75 MG capsule Take 1 capsule (75 mg total) by mouth every 12 (twelve) hours. 03/31/22   Molpus, John, MD      Allergies    Penicillins    Review of Systems   Review of Systems  Physical Exam Updated Vital Signs BP 136/77 (BP Location: Right Arm)   Pulse 73   Temp 98.5 F (36.9 C) (Oral)   Resp 16   Ht '5\' 7"'$  (1.702 m)   Wt 93 kg   SpO2 98%   BMI 32.11 kg/m  Physical Exam Constitutional:      General: He is not in acute distress. HENT:     Head: Normocephalic and atraumatic.  Eyes:     Conjunctiva/sclera: Conjunctivae normal.     Pupils: Pupils are equal, round, and reactive to light.  Cardiovascular:     Rate and Rhythm: Normal rate and regular rhythm.  Pulmonary:     Effort: Pulmonary effort is normal. No respiratory distress.   Abdominal:     General: There is no distension.     Tenderness: There is no abdominal tenderness.  Skin:    General: Skin is warm and dry.  Neurological:     General: No focal deficit present.     Mental Status: He is alert. Mental status is at baseline.  Psychiatric:        Mood and Affect: Mood normal.        Behavior: Behavior normal.     ED Results / Procedures / Treatments   Labs (all labs ordered are listed, but only abnormal results are displayed) Labs Reviewed  RESP PANEL BY RT-PCR (RSV, FLU A&B, COVID)  [id number]    EKG None  Radiology DG Chest 2 View  Result Date: 04/12/2022 CLINICAL DATA:  Persistent cough.  Evaluate for pneumonia. EXAM: CHEST - 2 VIEW COMPARISON:  03/30/2022 FINDINGS: The heart size and mediastinal contours are within normal limits. Both lungs are clear. The visualized skeletal structures are unremarkable. IMPRESSION: No active cardiopulmonary disease. Electronically Signed   By: Kerby Moors M.D.   On: 04/12/2022 08:17    Procedures Procedures    Medications Ordered in ED Medications - No data to display  ED Course/ Medical Decision Making/ A&P  Medical Decision Making Amount and/or Complexity of Data Reviewed Radiology: ordered.  Risk Prescription drug management.   Patient is here with persistent cough.  Differential would include postviral syndrome versus postnasal drip versus secondary bacterial infection or pneumonia versus other  I personally reviewed and interpreted the patient's x-rays, notable for no acute abnormalities.  His COVID and flu tests are negative today.  He did have influenza at least 15 days ago when he tested positive in the ED.  It is likely cleared from his system.  The patient is not hypoxic.  There is no indication for antibiotics at this time.  I suspect is postviral syndrome.  I do not hear any wheezing nor does any history of reactive airway disease suggest this is COPD or  asthma.  I recommended continued supportive care and will prescribe some Tessalon Perles for home.  He verbalized understanding.        Final Clinical Impression(s) / ED Diagnoses Final diagnoses:  Subacute cough    Rx / DC Orders ED Discharge Orders          Ordered    benzonatate (TESSALON) 100 MG capsule  3 times daily PRN        04/12/22 0825              Wyvonnia Dusky, MD 04/12/22 [phone]

## 2022-06-27 ENCOUNTER — Encounter (HOSPITAL_BASED_OUTPATIENT_CLINIC_OR_DEPARTMENT_OTHER): Payer: Self-pay

## 2022-06-27 ENCOUNTER — Other Ambulatory Visit: Payer: Self-pay

## 2022-06-27 ENCOUNTER — Emergency Department (HOSPITAL_BASED_OUTPATIENT_CLINIC_OR_DEPARTMENT_OTHER): Payer: No Typology Code available for payment source

## 2022-06-27 ENCOUNTER — Emergency Department (HOSPITAL_BASED_OUTPATIENT_CLINIC_OR_DEPARTMENT_OTHER)
Admission: EM | Admit: 2022-06-27 | Discharge: 2022-06-27 | Disposition: A | Discharge status: Home / Self Care | Payer: No Typology Code available for payment source | Attending: Emergency Medicine | Admitting: Emergency Medicine

## 2022-06-27 DIAGNOSIS — R140 Abdominal distension (gaseous): Secondary | ICD-10-CM | POA: Insufficient documentation

## 2022-06-27 DIAGNOSIS — M791 Myalgia, unspecified site: Secondary | ICD-10-CM | POA: Insufficient documentation

## 2022-06-27 DIAGNOSIS — K529 Noninfective gastroenteritis and colitis, unspecified: Secondary | ICD-10-CM

## 2022-06-27 LAB — CBC WITH DIFFERENTIAL/PLATELET
Abs Immature Granulocytes: 0.01 10*3/uL (ref 0.00–0.07)
Basophils Absolute: 0 10*3/uL (ref 0.0–0.1)
Basophils Relative: 0 %
Eosinophils Absolute: 0 10*3/uL (ref 0.0–0.5)
Eosinophils Relative: 0 %
HCT: 40.1 % (ref 39.0–52.0)
Hemoglobin: 13.4 g/dL (ref 13.0–17.0)
Immature Granulocytes: 0 %
Lymphocytes Relative: 24 %
Lymphs Abs: 1.2 10*3/uL (ref 0.7–4.0)
MCH: 31.2 pg (ref 26.0–34.0)
MCHC: 33.4 g/dL (ref 30.0–36.0)
MCV: 93.5 fL (ref 80.0–100.0)
Monocytes Absolute: 0.4 10*3/uL (ref 0.1–1.0)
Monocytes Relative: 8 %
Neutro Abs: 3.3 10*3/uL (ref 1.7–7.7)
Neutrophils Relative %: 68 %
Platelets: 216 10*3/uL (ref 150–400)
RBC: 4.29 MIL/uL (ref 4.22–5.81)
RDW: 12 % (ref 11.5–15.5)
WBC: 4.9 10*3/uL (ref 4.0–10.5)
nRBC: 0 % (ref 0.0–0.2)

## 2022-06-27 LAB — COMPREHENSIVE METABOLIC PANEL
ALT: 25 U/L (ref 0–44)
AST: 24 U/L (ref 15–41)
Albumin: 4 g/dL (ref 3.5–5.0)
Alkaline Phosphatase: 62 U/L (ref 38–126)
Anion gap: 8 (ref 5–15)
BUN: 16 mg/dL (ref 6–20)
CO2: 27 mmol/L (ref 22–32)
Calcium: 8.9 mg/dL (ref 8.9–10.3)
Chloride: 102 mmol/L (ref 98–111)
Creatinine, Ser: 0.91 mg/dL (ref 0.61–1.24)
GFR, Estimated: >60 mL/min (ref >60)
Glucose, Bld: 109 mg/dL — ABNORMAL HIGH (ref 70–99)
Potassium: 3.8 mmol/L (ref 3.5–5.1)
Sodium: 137 mmol/L (ref 135–145)
Total Bilirubin: 1.1 mg/dL (ref 0.3–1.2)
Total Protein: 7.9 g/dL (ref 6.5–8.1)

## 2022-06-27 LAB — LIPASE, BLOOD: Lipase: 22 U/L (ref 11–51)

## 2022-06-27 MED ORDER — ONDANSETRON HCL 4 MG/2ML IJ SOLN
4.0000 mg | Freq: Once | INTRAMUSCULAR | Status: AC
  Administered 2022-06-27: 4 mg via INTRAVENOUS
  Filled 2022-06-27: qty 2

## 2022-06-27 MED ORDER — ONDANSETRON 4 MG PO TBDP: 4.0000 mg | ORAL_TABLET | Freq: Three times a day (TID) | ORAL | 0 refills | Status: DC | PRN

## 2022-06-27 MED ORDER — LACTATED RINGERS IV BOLUS
1000.0000 mL | Freq: Once | INTRAVENOUS | Status: AC
  Administered 2022-06-27: 1000 mL via INTRAVENOUS

## 2022-06-27 MED ORDER — PROCHLORPERAZINE EDISYLATE 10 MG/2ML IJ SOLN
10.0000 mg | Freq: Once | INTRAMUSCULAR | Status: AC
  Administered 2022-06-27: 10 mg via INTRAVENOUS
  Filled 2022-06-27: qty 2

## 2022-06-27 MED ORDER — IOHEXOL 300 MG/ML  SOLN
100.0000 mL | Freq: Once | INTRAMUSCULAR | Status: AC | PRN
  Administered 2022-06-27: 100 mL via INTRAVENOUS

## 2022-06-27 MED ORDER — DIPHENOXYLATE-ATROPINE 2.5-0.025 MG PO TABS
2.0000 | ORAL_TABLET | Freq: Once | ORAL | Status: AC
  Administered 2022-06-27: 2 via ORAL
  Filled 2022-06-27: qty 2

## 2022-06-27 NOTE — ED Provider Notes (Signed)
[facility]ENC[facility] Provider Note   CSN: [id number] Arrival date & time: 06/27/22  0546     History  Chief Complaint  Patient presents with   Emesis   Generalized Body Aches    Mitchell Adams is a 62 y.o. male.  63 year old male that presents the ER today secondary to nausea, vomiting, diarrhea for the last couple days.  States it was bile-like but is actually light green/yellow in color.  Has had some chills but no measured fevers.  No blood in his stool or his vomitus.  No known sick contacts.  No suspicious food intake.  States his abdomen feels distended as well.  Has a history of a hernia surgeries in the past.   Emesis      Home Medications Prior to Admission medications   Medication Sig Start Date End Date Taking? Authorizing Provider  benzonatate (TESSALON) 100 MG capsule Take 1 capsule (100 mg total) by mouth 3 (three) times daily as needed for up to 21 doses for cough. 04/12/22   Wyvonnia Dusky, MD  chlorpheniramine-HYDROcodone (TUSSIONEX) 10-8 MG/5ML Take 5 mLs by mouth every 12 (twelve) hours as needed for cough. 03/31/22   Molpus, John, MD  oseltamivir (TAMIFLU) 75 MG capsule Take 1 capsule (75 mg total) by mouth every 12 (twelve) hours. 03/31/22   Molpus, Jenny Reichmann, MD      Allergies    Penicillins    Review of Systems   Review of Systems  Gastrointestinal:  Positive for vomiting.    Physical Exam Updated Vital Signs BP 136/85 (BP Location: Right Arm)   Pulse 87   Temp 98.2 F (36.8 C) (Oral)   Resp 18   Ht '5\' 7"'$  (1.702 m)   Wt 93 kg   SpO2 97%   BMI 32.11 kg/m  Physical Exam Vitals and nursing note reviewed.  Constitutional:      Appearance: He is well-developed.  HENT:     Head: Normocephalic and atraumatic.  Eyes:     Pupils: Pupils are equal, round, and reactive to light.  Cardiovascular:     Rate and Rhythm: Normal rate.  Pulmonary:     Effort: Pulmonary effort is normal. No respiratory distress.   Abdominal:     General: Abdomen is flat. There is distension.     Tenderness: There is abdominal tenderness.  Musculoskeletal:        General: Normal range of motion.     Cervical back: Normal range of motion.  Skin:    General: Skin is warm and dry.  Neurological:     General: No focal deficit present.     Mental Status: He is alert.     ED Results / Procedures / Treatments   Labs (all labs ordered are listed, but only abnormal results are displayed) Labs Reviewed  CBC WITH DIFFERENTIAL/PLATELET  COMPREHENSIVE METABOLIC PANEL  LIPASE, BLOOD    EKG None  Radiology No results found.  Procedures Procedures    Medications Ordered in ED Medications  lactated ringers bolus 1,000 mL (1,000 mLs Intravenous New Bag/Given 06/27/22 0653)  ondansetron (ZOFRAN) injection 4 mg (4 mg Intravenous Given 06/27/22 0656)  diphenoxylate-atropine (LOMOTIL) 2.5-0.025 MG per tablet 2 tablet (2 tablets Oral Given 06/27/22 [id number])    ED Course/ Medical Decision Making/ A&P                             Medical Decision Making Amount and/or  Complexity of Data Reviewed Labs: ordered. Radiology: ordered.  Risk Prescription drug management.   With abdominal distension and tenderness with history of surgery, will get labs, ct and treat symptomatically.   Care transferred pending completion of workup and reevaluation for symptom control.    Final Clinical Impression(s) / ED Diagnoses Final diagnoses:  None    Rx / DC Orders ED Discharge Orders     None         Kinneth Fujiwara, Corene Cornea, MD 06/27/22 [phone]

## 2022-06-27 NOTE — ED Notes (Signed)
EDP at [facility] 

## 2022-06-27 NOTE — ED Triage Notes (Signed)
Pt reports he began to feel bad yesterday. Pt with vomiting x6 in 24 hours (green bile per pt), chills, and overall not wanting to eat much. Denies abd pain today; severe pain yesterday. Pt also endorses diarrhea x4 times in past 24 hours. Gen body aches.

## 2022-06-27 NOTE — ED Notes (Signed)
To CT via stretcher

## 2022-06-27 NOTE — ED Provider Notes (Signed)
  Physical Exam  BP 136/85 (BP Location: Right Arm)   Pulse 87   Temp 98.2 F (36.8 C) (Oral)   Resp 18   Ht '5\' 7"'$  (1.702 m)   Wt 93 kg   SpO2 97%   BMI 32.11 kg/m     Procedures  Procedures  ED Course / MDM     Medical Decision Making Amount and/or Complexity of Data Reviewed Labs: ordered. Radiology: ordered.  Risk Prescription drug management.   62 year old male presenting to the emergency department with roughly 24 hours of nausea, vomiting, abdominal distention, diarrhea.  Suspect likely gastroenteritis.  His labs and imaging pending.  CT imaging unremarkable, laboratory evaluation unremarkable.  Patient symptoms are consistent with gastroenteritis.  He passed a p.o. challenge.  Will discharge with a course of Zofran, and advised continued hydration outpatient.       Regan Lemming, MD 06/27/22 [phone]

## 2023-01-11 ENCOUNTER — Other Ambulatory Visit: Payer: Self-pay

## 2023-01-11 ENCOUNTER — Encounter (HOSPITAL_BASED_OUTPATIENT_CLINIC_OR_DEPARTMENT_OTHER): Payer: Self-pay | Admitting: Emergency Medicine

## 2023-01-11 ENCOUNTER — Emergency Department (HOSPITAL_BASED_OUTPATIENT_CLINIC_OR_DEPARTMENT_OTHER)
Admission: EM | Admit: 2023-01-11 | Discharge: 2023-01-11 | Disposition: A | Discharge status: Home / Self Care | Payer: No Typology Code available for payment source | Attending: Emergency Medicine | Admitting: Emergency Medicine

## 2023-01-11 ENCOUNTER — Emergency Department (HOSPITAL_BASED_OUTPATIENT_CLINIC_OR_DEPARTMENT_OTHER): Payer: No Typology Code available for payment source

## 2023-01-11 DIAGNOSIS — J069 Acute upper respiratory infection, unspecified: Secondary | ICD-10-CM | POA: Insufficient documentation

## 2023-01-11 DIAGNOSIS — R112 Nausea with vomiting, unspecified: Secondary | ICD-10-CM | POA: Insufficient documentation

## 2023-01-11 DIAGNOSIS — R059 Cough, unspecified: Secondary | ICD-10-CM | POA: Diagnosis present

## 2023-01-11 DIAGNOSIS — Z1152 Encounter for screening for COVID-19: Secondary | ICD-10-CM | POA: Insufficient documentation

## 2023-01-11 LAB — RESP PANEL BY RT-PCR (RSV, FLU A&B, COVID)  RVPGX2
Influenza A by PCR: NEGATIVE
Influenza B by PCR: NEGATIVE
Resp Syncytial Virus by PCR: NEGATIVE
SARS Coronavirus 2 by RT PCR: NEGATIVE

## 2023-01-11 MED ORDER — ONDANSETRON 4 MG PO TBDP: 4.0000 mg | ORAL_TABLET | Freq: Three times a day (TID) | ORAL | 0 refills | Status: AC | PRN

## 2023-01-11 NOTE — Discharge Instructions (Signed)
Recommend Tylenol and/or ibuprofen for body aches and if any fever develops. Take Zofran if needed for nausea/vomiting.   Follow up with your doctor if symptoms continue.

## 2023-01-11 NOTE — ED Triage Notes (Addendum)
Reports cough, n/v, body aches, congestion since yesterday afternoon, in NAD during triage  States left arm is hurting significantly more than anywhere else, denies injury or trauma

## 2023-01-11 NOTE — ED Provider Notes (Signed)
[facility] Provider Note   CSN: [id number] Arrival date & time: 01/11/23  1703     History  Chief Complaint  Patient presents with   Cough    Mitchell Adams is a 62 y.o. male.  Patient with symptoms of generalized body aches, nasal congestion, cough, nausea/vomiting. He reports symptoms started yesterday and persist today. No diarrhea or fever. Other than body aches, he denies pain. No known sick contacts.   The history is provided by the patient. No language interpreter was used.  Cough      Home Medications Prior to Admission medications   Medication Sig Start Date End Date Taking? Authorizing Provider  benzonatate (TESSALON) 100 MG capsule Take 1 capsule (100 mg total) by mouth 3 (three) times daily as needed for up to 21 doses for cough. 04/12/22   Terald Sleeper, MD  chlorpheniramine-HYDROcodone (TUSSIONEX) 10-8 MG/5ML Take 5 mLs by mouth every 12 (twelve) hours as needed for cough. 03/31/22   Molpus, John, MD  ondansetron (ZOFRAN-ODT) 4 MG disintegrating tablet Take 1 tablet (4 mg total) by mouth every 8 (eight) hours as needed. 01/11/23   Elpidio Anis, PA-C  oseltamivir (TAMIFLU) 75 MG capsule Take 1 capsule (75 mg total) by mouth every 12 (twelve) hours. 03/31/22   Molpus, Jonny Ruiz, MD      Allergies    Penicillins    Review of Systems   Review of Systems  Respiratory:  Positive for cough.     Physical Exam Updated Vital Signs BP 128/85   Pulse 75   Temp 98.1 F (36.7 C) (Oral)   Resp 16   Ht 5\' 7"  (1.702 m)   Wt 89.8 kg   SpO2 97%   BMI 31.01 kg/m  Physical Exam Vitals and nursing note reviewed.  Constitutional:      Appearance: Normal appearance.  HENT:     Mouth/Throat:     Mouth: Mucous membranes are moist.  Cardiovascular:     Rate and Rhythm: Normal rate and regular rhythm.     Heart sounds: No murmur heard. Pulmonary:     Effort: Pulmonary effort is normal.     Breath sounds: No wheezing,  rhonchi or rales.  Abdominal:     Palpations: Abdomen is soft.     Tenderness: There is no abdominal tenderness.  Musculoskeletal:        General: Normal range of motion.     Cervical back: Normal range of motion and neck supple.  Skin:    General: Skin is warm and dry.  Neurological:     Mental Status: He is alert.     ED Results / Procedures / Treatments   Labs (all labs ordered are listed, but only abnormal results are displayed) Labs Reviewed  RESP PANEL BY RT-PCR (RSV, FLU A&B, COVID)  [id number]    EKG EKG Interpretation Date/Time:  Thursday January 11 2023 18:09:29 EDT Ventricular Rate:  88 PR Interval:  158 QRS Duration:  102 QT Interval:  364 QTC Calculation: 441 R Axis:   68  Text Interpretation: Sinus rhythm Posterior infarct, old No significant change since last tracing Confirmed by Melene Plan [phone]) on 01/11/2023 7:54:42 PM  Radiology DG Chest 2 View  Result Date: 01/11/2023 CLINICAL DATA:  Cough, body aches, congestion EXAM: CHEST - 2 VIEW COMPARISON:  04/12/2022 FINDINGS: Frontal and lateral views of the chest demonstrate an unremarkable cardiac silhouette. No acute airspace disease, effusion, or pneumothorax. No acute bony abnormalities. IMPRESSION: 1.  No acute intrathoracic process. Electronically Signed   By: Sharlet Salina M.D.   On: 01/11/2023 19:28    Procedures Procedures    Medications Ordered in ED Medications - No data to display  ED Course/ Medical Decision Making/ A&P Clinical Course as of 01/11/23 2037  Thu Jan 11, 2023  2031 Patient with URI symptoms, body aches, nausea with 2 episodes vomiting today. Viral panel and CXR negative. He reports he has left arm pain and left hip pain that he takes Tylenol for on a regular basis but feel more painful with this illness. No EKG changes. Doubt left arm pain indicates cardiac condition. VSS, no fever, afebrile. Treat symptomatically, Zofran for nausea. Offered OOW document but he declines to be out of  work.  [SU]    Clinical Course User Index [SU] Elpidio Anis, PA-C                                 Medical Decision Making Amount and/or Complexity of Data Reviewed Radiology: ordered.           Final Clinical Impression(s) / ED Diagnoses Final diagnoses:  Viral URI with cough  Nausea and vomiting, unspecified vomiting type    Rx / DC Orders ED Discharge Orders          Ordered    ondansetron (ZOFRAN-ODT) 4 MG disintegrating tablet  Every 8 hours PRN        01/11/23 2036              Elpidio Anis, PA-C 01/11/23 2038    Melene Plan, DO 01/11/23 2053

## 2024-02-03 IMAGING — CR DG KNEE COMPLETE 4+V*R*
4 series · 4 of 4 positions shown · non-contrast
Comparison: None.

CLINICAL DATA: Right knee pain for 2-3 days.

EXAM:
RIGHT KNEE - COMPLETE 4+ VIEW

[t knee ap right]
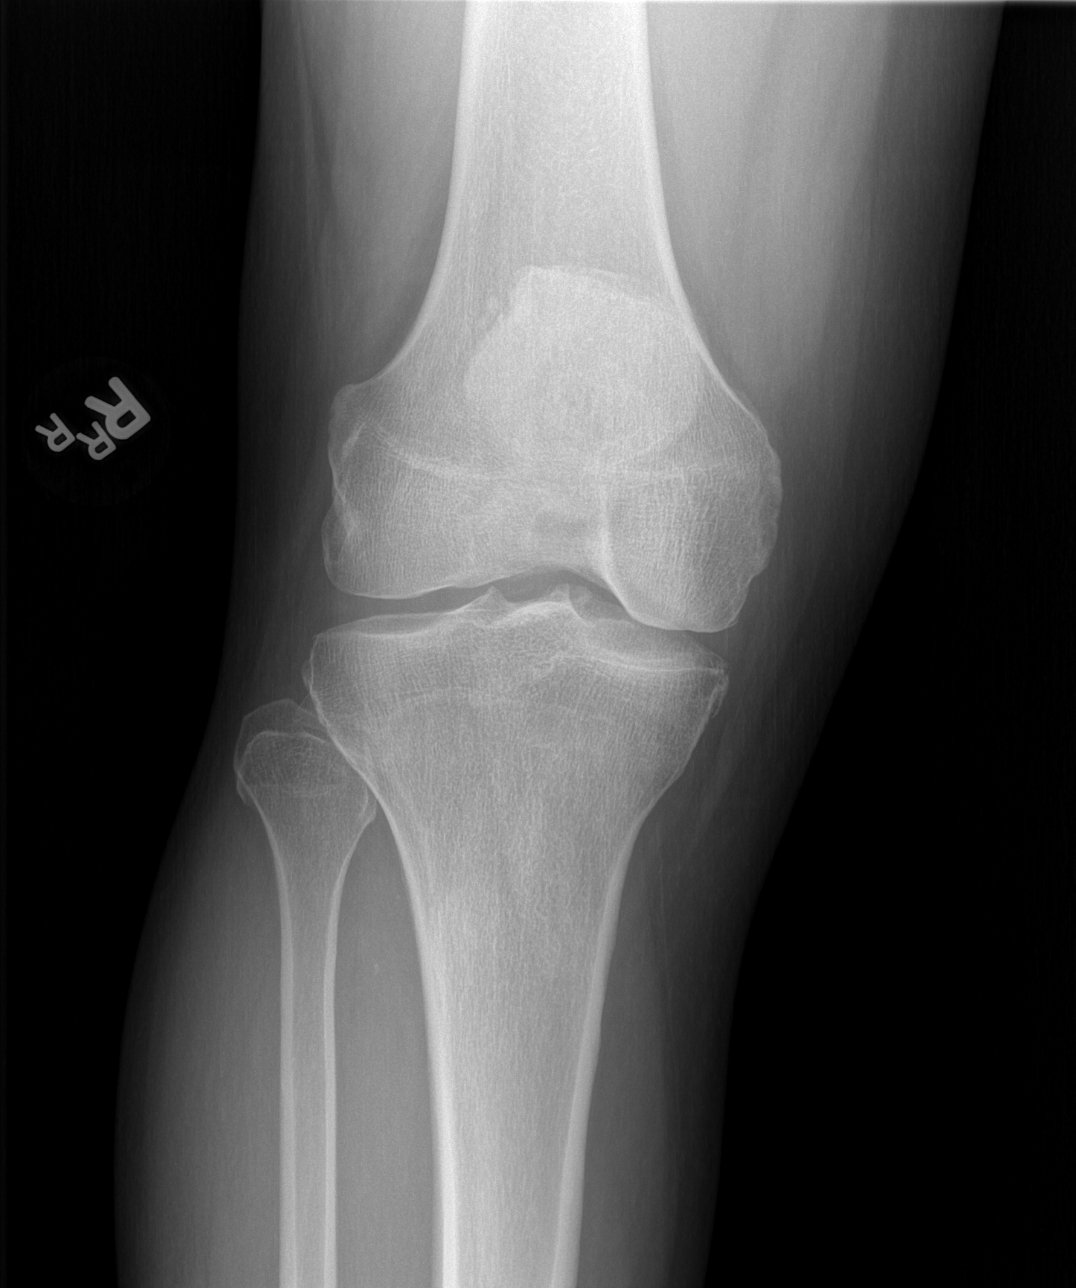

[t knee oblique right (1 of 2)]
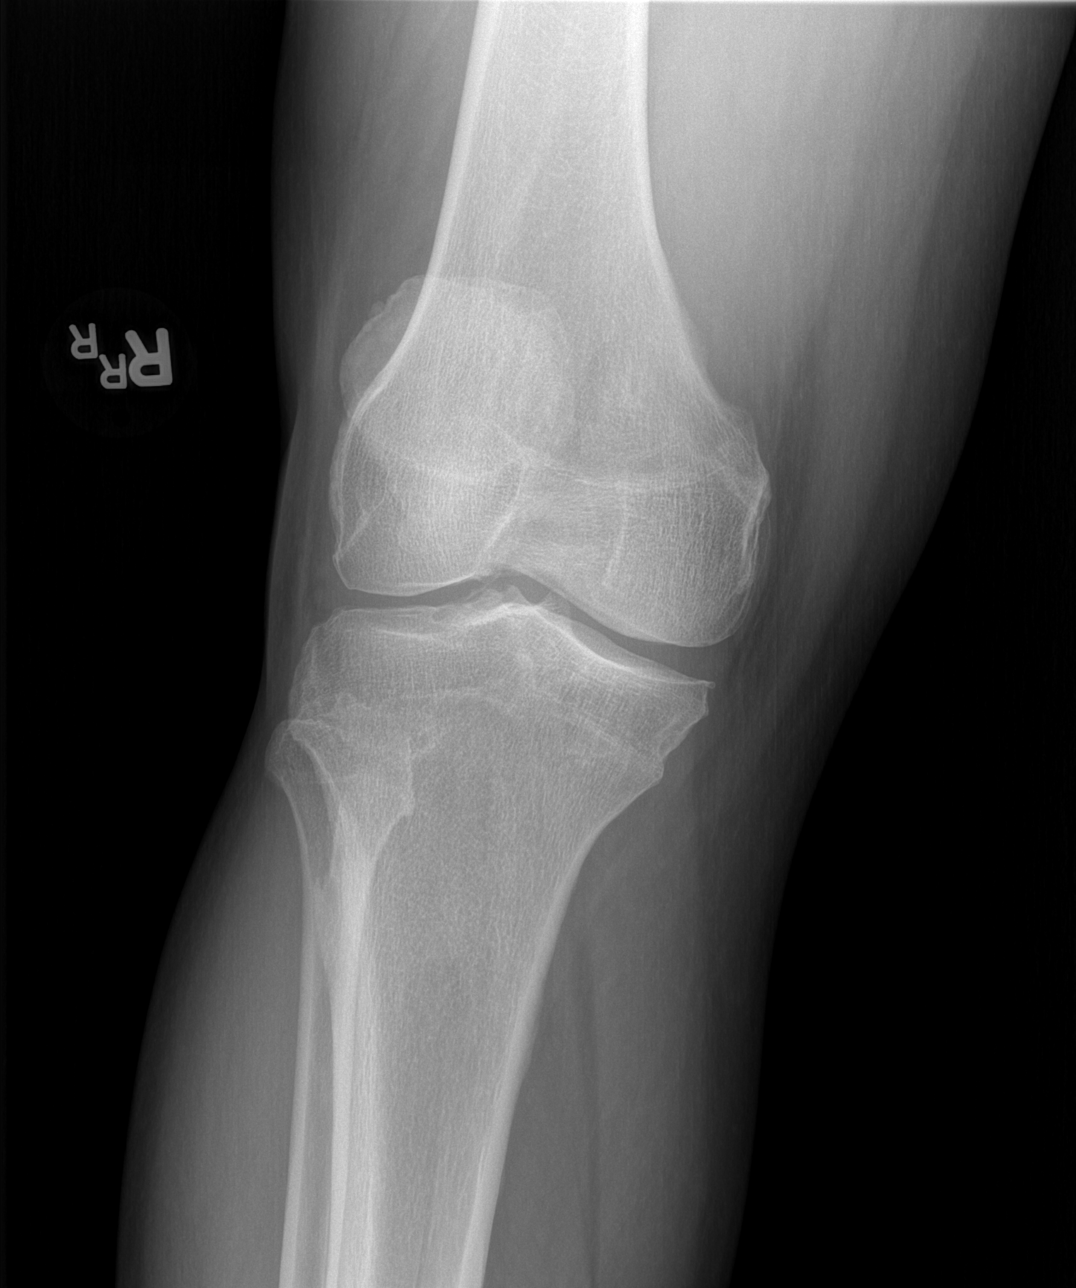

[t knee oblique right (2 of 2)]
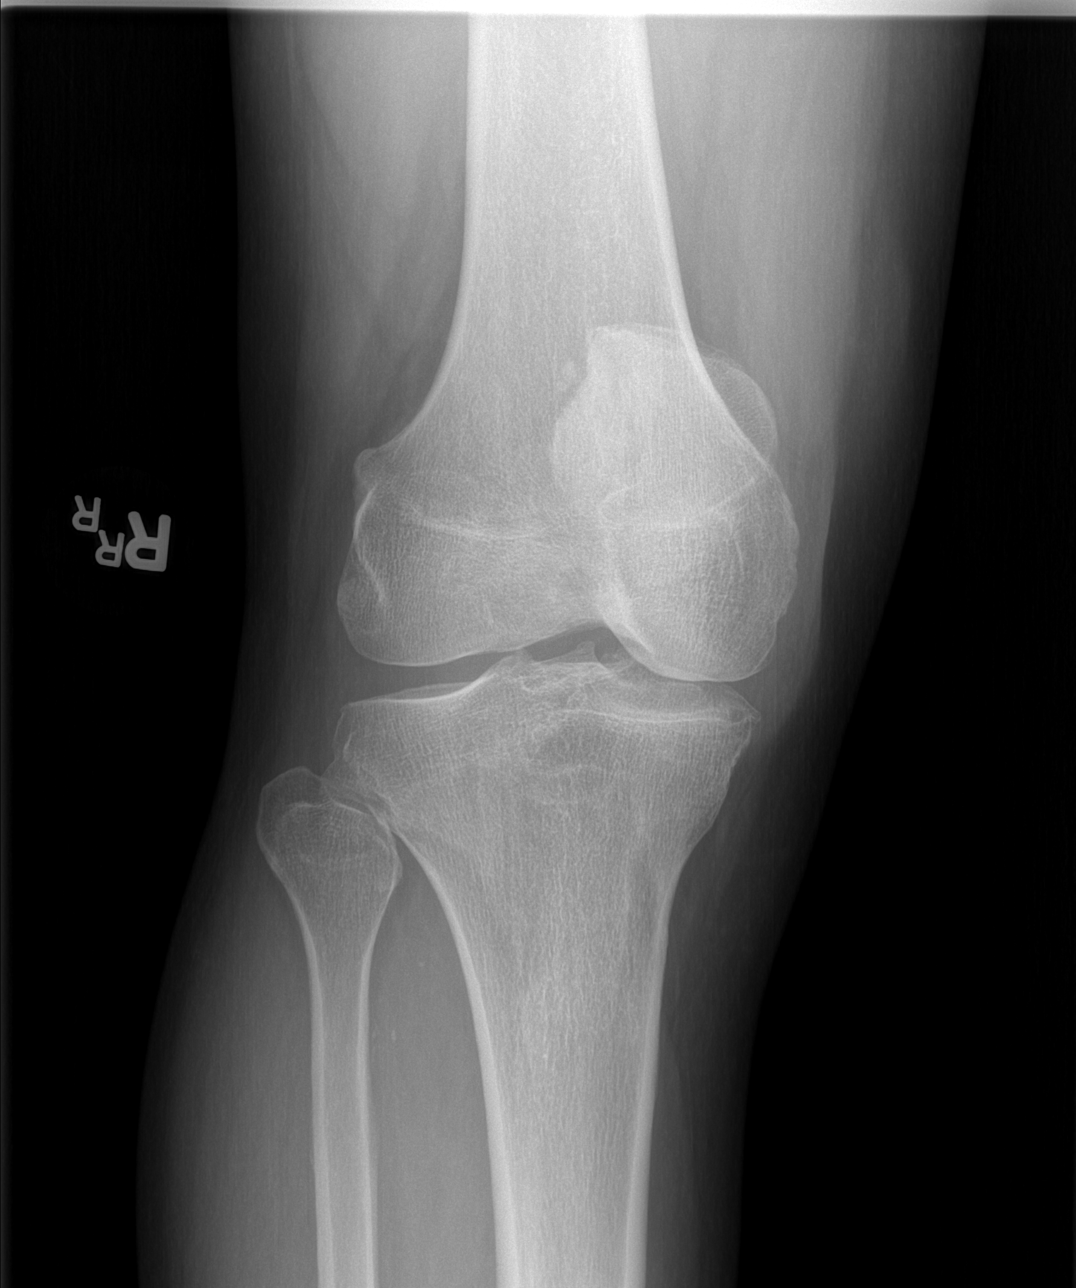

[t knee lat right]
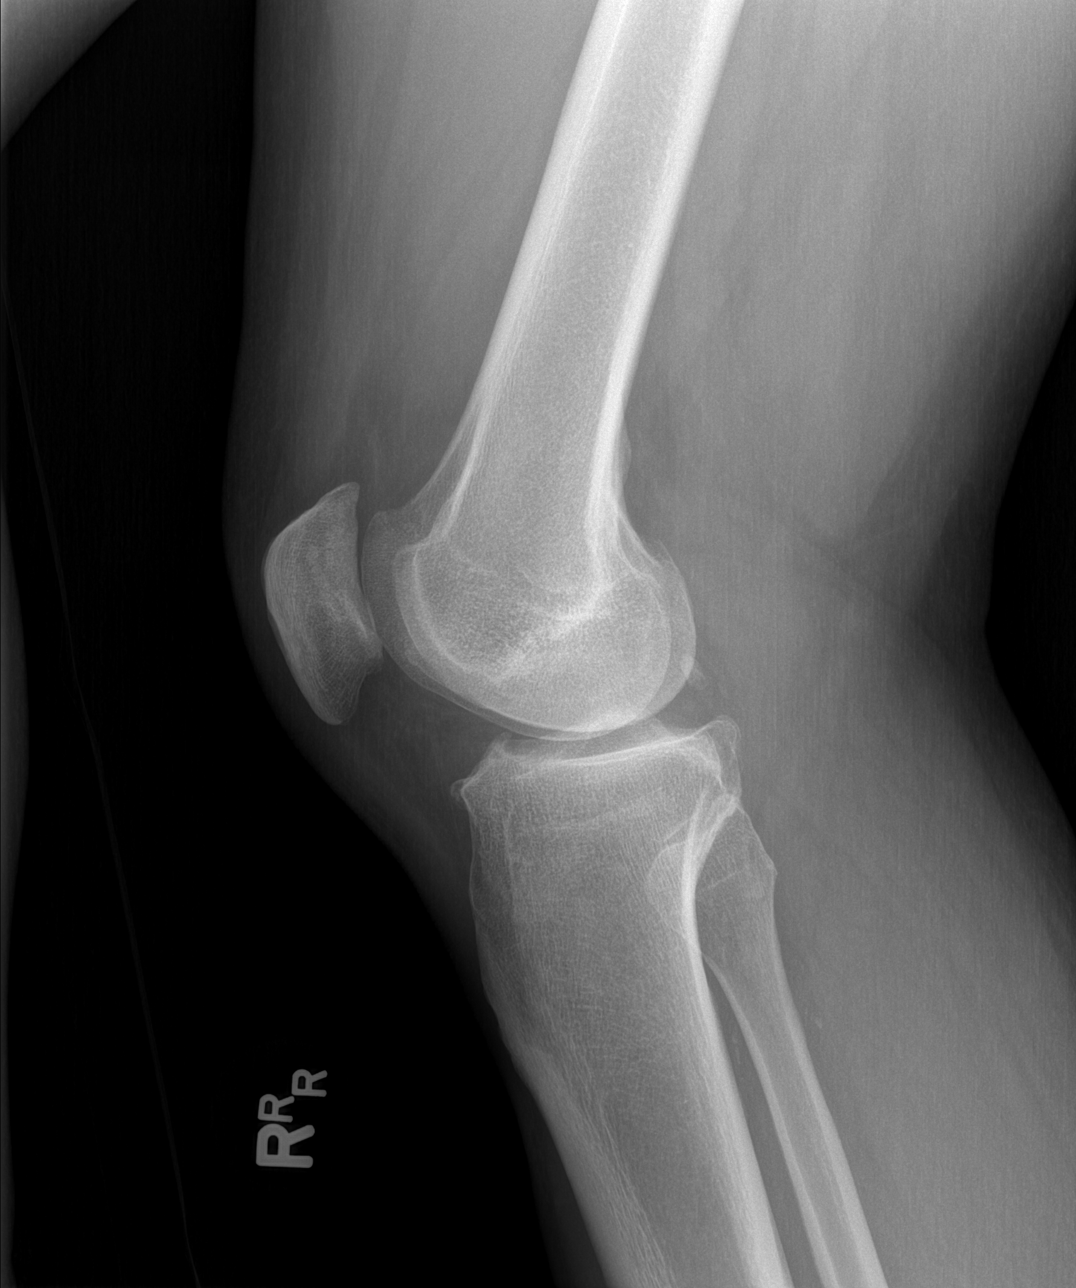

[4 of 4 positions shown; findings below may reference images not displayed]

FINDINGS: Minimal medial compartment joint space narrowing and peripheral
osteophytosis degenerative change. Mild-to-moderate superior
patellar degenerative osteophytosis. Likely mild patellofemoral
joint space narrowing. No joint effusion. Developmental variant
bipartite patella with tiny associated superolateral ossicle. No
acute fracture or dislocation.
IMPRESSION: Minimal medial compartment and mild patellofemoral compartment
osteoarthritis.

Likely normal variant bipartite patella.
